# Patient Record
Sex: Male | Born: 1950 | Race: White | Hispanic: No | Marital: Married | Smoking: Former smoker
Health system: Southern US, Community
[De-identification: ages and names within clinical notes are randomized; demographics above are authoritative.]

## PROBLEM LIST (undated history)

## (undated) DIAGNOSIS — R51 Headache: Secondary | ICD-10-CM

## (undated) DIAGNOSIS — J309 Allergic rhinitis, unspecified: Secondary | ICD-10-CM

## (undated) DIAGNOSIS — J930 Spontaneous tension pneumothorax: Secondary | ICD-10-CM

## (undated) DIAGNOSIS — I639 Cerebral infarction, unspecified: Secondary | ICD-10-CM

## (undated) DIAGNOSIS — E119 Type 2 diabetes mellitus without complications: Secondary | ICD-10-CM

## (undated) DIAGNOSIS — R569 Unspecified convulsions: Secondary | ICD-10-CM

## (undated) DIAGNOSIS — Z87898 Personal history of other specified conditions: Secondary | ICD-10-CM

## (undated) DIAGNOSIS — E785 Hyperlipidemia, unspecified: Secondary | ICD-10-CM

## (undated) HISTORY — DX: Cerebral infarction, unspecified: I63.9

## (undated) HISTORY — PX: CATARACT EXTRACTION: SUR2

## (undated) HISTORY — DX: Type 2 diabetes mellitus without complications: E11.9

## (undated) HISTORY — DX: Spontaneous tension pneumothorax: J93.0

## (undated) HISTORY — PX: CARDIAC CATHETERIZATION: SHX172

## (undated) HISTORY — PX: TONSILLECTOMY: SUR1361

## (undated) HISTORY — DX: Hyperlipidemia, unspecified: E78.5

## (undated) HISTORY — DX: Personal history of other specified conditions: Z87.898

## (undated) HISTORY — PX: POPLITEAL SYNOVIAL CYST EXCISION: SUR555

## (undated) HISTORY — DX: Allergic rhinitis, unspecified: J30.9

---

## 2008-05-10 ENCOUNTER — Encounter: Admission: RE | Admit: 2008-05-10 | Discharge: 2008-06-08 | Payer: Self-pay | Admitting: Emergency Medicine

## 2013-08-13 ENCOUNTER — Encounter: Payer: Self-pay | Admitting: General Surgery

## 2013-08-13 ENCOUNTER — Ambulatory Visit (INDEPENDENT_AMBULATORY_CARE_PROVIDER_SITE_OTHER): Payer: Commercial Indemnity | Admitting: Cardiology

## 2013-08-13 ENCOUNTER — Encounter: Payer: Self-pay | Admitting: Cardiology

## 2013-08-13 VITALS — BP 136/85 | HR 70 | Ht 76.0 in | Wt 232.0 lb

## 2013-08-13 DIAGNOSIS — R55 Syncope and collapse: Secondary | ICD-10-CM | POA: Insufficient documentation

## 2013-08-13 DIAGNOSIS — E785 Hyperlipidemia, unspecified: Secondary | ICD-10-CM

## 2013-08-13 DIAGNOSIS — J930 Spontaneous tension pneumothorax: Secondary | ICD-10-CM | POA: Insufficient documentation

## 2013-08-13 DIAGNOSIS — R42 Dizziness and giddiness: Secondary | ICD-10-CM

## 2013-08-13 NOTE — Progress Notes (Signed)
  [address], [address] [place], [place]  [address] Phone: [phone] Fax:  [phone]  Date:  08/13/2013   ID:  Darin Harrington, DOB 04-Sep-1951, MRN [id number]  PCP:  No primary provider on file.  Cardiologist:  NONE     History of Present Illness: Darin Harrington is a 62 y.o. male with a history of DM who presents today for evaluation of dizziness and syncope.and He describes these as small incidences.  He says it starts out as a chill with shaking and feeling cold and then gets flushed and moves to his face and then he feels like he is going to pass out.  Afterwards he will feel nauseated.  They can occur daily but then sometimes he can go weeks without having any symptoms.  He denies any palpitation, chest pain, SOB or syncope.  At night his wife notices his lips moving and his tongue will move out of his mouth.  He has not seen a Neurologist.   Wt Readings from Last 3 Encounters:  08/13/13 232 lb (105.235 kg)     Past Medical History  Diagnosis Date  . Diabetes mellitus without complication   . Hyperlipidemia   . Pneumothorax, spontaneous, tension     Current Outpatient Prescriptions  Medication Sig Dispense Refill  . aspirin 81 MG tablet Take 81 mg by mouth daily.      . metFORMIN (GLUCOPHAGE) 500 MG tablet Take 500 mg by mouth 2 (two) times daily with a meal.        No current facility-administered medications for this visit.    Allergies:   No Known Allergies  Social History:  The patient  reports that he has never smoked. He does not have any smokeless tobacco history on file. He reports that he does not drink alcohol or use illicit drugs.   Family History:  The patient's family history includes Alzheimer's disease in his father.   ROS:  Please see the history of present illness.      All other systems reviewed and negative.   PHYSICAL EXAM: VS:  BP 136/85  Pulse 70  Ht 6\' 4"  (1.93 m)  Wt 232 lb (105.235 kg)  BMI 28.25 kg/m2 Well nourished, well  developed, in no acute distress HEENT: normal Neck: no JVD Cardiac:  normal S1, S2; RRR; no murmur Lungs:  clear to auscultation bilaterally, no wheezing, rhonchi or rales Abd: soft, nontender, no hepatomegaly Ext: no edema Skin: warm and dry Neuro:  CNs 2-12 intact, no focal abnormalities noted  EKG:  NSR   No ST/T wave changes with normal QTcz  ASSESSMENT AND PLAN:  1. Myriad of symptoms consisting of shaking and flushing along with a feeling that he is going to pass out but no syncope.  Also history by his wife of him having lip quivering with his tongue hanging out. ? Cardiogenic vs. Seizure disorder.  He is not orthostatic on exam today.    - ETT to rule out ischemia  - 2D echo to assess for structural heart disease  - Event monitor to assess for arrhythmias 2. DM 3. Chronic Tinnitus  Follwup with me in 4 weeks  Signed, Armanda Magic, MD 08/13/2013 9:06 AM

## 2013-08-13 NOTE — Patient Instructions (Signed)
Your physician recommends that you continue on your current medications as directed. Please refer to the Current Medication list given to you today.  Your physician has requested that you have en exercise stress myoview. For further information please visit [email or web address] Please follow instruction sheet, as given.  Your physician has requested that you have an echocardiogram. Echocardiography is a painless test that uses sound waves to create images of your heart. It provides your doctor with information about the size and shape of your heart and how well your heart's chambers and valves are working. This procedure takes approximately one hour. There are no restrictions for this procedure.  Your physician has recommended that you wear an event monitor. Event monitors are medical devices that record the heart's electrical activity. Doctors most often [place] these monitors to diagnose arrhythmias. Arrhythmias are problems with the speed or rhythm of the heartbeat. The monitor is a small, portable device. You can wear one while you do your normal daily activities. This is usually used to diagnose what is causing palpitations/syncope (passing out).  Your physician recommends that you schedule a follow-up appointment in: 4 weeks with Dr. Mayford Knife

## 2013-08-16 ENCOUNTER — Other Ambulatory Visit: Payer: Self-pay | Admitting: General Surgery

## 2013-08-16 DIAGNOSIS — R55 Syncope and collapse: Secondary | ICD-10-CM

## 2013-08-16 DIAGNOSIS — R42 Dizziness and giddiness: Secondary | ICD-10-CM

## 2013-08-24 ENCOUNTER — Encounter: Payer: Self-pay | Admitting: Cardiology

## 2013-08-24 ENCOUNTER — Encounter (INDEPENDENT_AMBULATORY_CARE_PROVIDER_SITE_OTHER): Payer: Commercial Indemnity

## 2013-08-24 ENCOUNTER — Ambulatory Visit (HOSPITAL_COMMUNITY): Payer: Commercial Indemnity | Attending: Cardiology | Admitting: Cardiology

## 2013-08-24 ENCOUNTER — Encounter: Payer: Self-pay | Admitting: *Deleted

## 2013-08-24 DIAGNOSIS — E785 Hyperlipidemia, unspecified: Secondary | ICD-10-CM | POA: Insufficient documentation

## 2013-08-24 DIAGNOSIS — R55 Syncope and collapse: Secondary | ICD-10-CM

## 2013-08-24 DIAGNOSIS — R42 Dizziness and giddiness: Secondary | ICD-10-CM

## 2013-08-24 DIAGNOSIS — E119 Type 2 diabetes mellitus without complications: Secondary | ICD-10-CM | POA: Insufficient documentation

## 2013-08-24 DIAGNOSIS — I079 Rheumatic tricuspid valve disease, unspecified: Secondary | ICD-10-CM | POA: Insufficient documentation

## 2013-08-24 NOTE — Progress Notes (Signed)
Echo performed. 

## 2013-08-24 NOTE — Progress Notes (Signed)
Patient ID: Darin Harrington, male   DOB: December 08, 1950, 62 y.o.   MRN: [id number] [employer] 30 day cardiac event monitor applied to patient.

## 2013-08-31 ENCOUNTER — Encounter: Payer: Self-pay | Admitting: Cardiology

## 2013-09-01 ENCOUNTER — Ambulatory Visit (INDEPENDENT_AMBULATORY_CARE_PROVIDER_SITE_OTHER): Payer: Commercial Indemnity | Admitting: Cardiology

## 2013-09-01 ENCOUNTER — Encounter (HOSPITAL_COMMUNITY): Payer: Commercial Indemnity

## 2013-09-01 DIAGNOSIS — R55 Syncope and collapse: Secondary | ICD-10-CM

## 2013-09-01 DIAGNOSIS — R42 Dizziness and giddiness: Secondary | ICD-10-CM

## 2013-09-01 NOTE — Progress Notes (Signed)
Exercise Treadmill Test  Pre-Exercise Testing Evaluation Rhythm: normal sinus  Rate: 71 bpm     Test  Exercise Tolerance Test Ordering MD: Armanda Magic, MD  Interpreting MD: Armanda Magic, MD  Unique Test No: 1  Treadmill:  1  Indication for ETT: Dizzy  Contraindication to ETT: No   Stress Modality: exercise - treadmill  Cardiac Imaging Performed: non   Protocol: standard Bruce - maximal  Max BP:  160/112  Max MPHR (bpm):  157 85% MPR (bpm):  133  MPHR obtained (bpm):  164 % MPHR obtained:  104  Reached 85% MPHR (min:sec):  5:58 Total Exercise Time (min-sec):  8:43  Workload in METS:  10.1 Borg Scale: 17  Reason ETT Terminated:  patient's desire to stop    ST Segment Analysis At Rest: normal ST segments - no evidence of significant ST depression With Exercise: no evidence of significant ST depression  Other Information Arrhythmia:  No Angina during ETT:  absent (0) Quality of ETT:  diagnostic  ETT Interpretation:  normal - no evidence of ischemia by ST analysis  Comments: Normal ETTat 10.1 mets  Recommendations: Await results of heart monitor

## 2013-09-06 ENCOUNTER — Telehealth: Payer: Self-pay | Admitting: Cardiology

## 2013-09-06 ENCOUNTER — Ambulatory Visit: Payer: Commercial Indemnity | Admitting: Cardiology

## 2013-09-06 NOTE — Telephone Encounter (Signed)
That is fine - we will go with data we have for 2 weeks and send monitor back

## 2013-09-06 NOTE — Telephone Encounter (Signed)
Made pt aware that he can return monitor and we will go off of the time he used monitor.

## 2013-09-06 NOTE — Telephone Encounter (Signed)
New message    Wearing monitor---now have blisters where electrodes were--pt took off monitor this am and want to send it back--will this be ok?  He is supposed to wear it until dec 24th

## 2013-09-06 NOTE — Telephone Encounter (Signed)
Yes he had a normal ETT and echo so cleared from cardiac standpoint for cataract surgery

## 2013-09-06 NOTE — Telephone Encounter (Signed)
Called and spoke to pt. He has been wearing the monitor since to 24th and he has also had a treadmill and echo done since starting the monitor. They blisters and itching is to bad for pt to even want to try the other electrodes [employer] has to offer. He wants to send back, and make sure Dr Mayford Knife might have been able to get what she needed from two weeks wearing the monitor. TO Dr. Mayford Knife to advise.

## 2013-09-06 NOTE — Telephone Encounter (Signed)
Pt wants to know if he is ok to have cataract surgery.

## 2013-09-06 NOTE — Telephone Encounter (Signed)
Made pt aware. Asked to call me back so I could send to the Dr. Doing the sx.

## 2013-09-08 NOTE — Telephone Encounter (Signed)
Sent to pts PCP and [facility].

## 2013-10-05 ENCOUNTER — Telehealth: Payer: Self-pay | Admitting: Cardiology

## 2013-10-05 NOTE — Telephone Encounter (Signed)
Pt was made aware of his results/ has f/u this week and will discuss further.

## 2013-10-05 NOTE — Telephone Encounter (Signed)
Please let patient know that heart monitor showed NSR with occasional PVC's and PAC's which are benign 

## 2013-10-08 ENCOUNTER — Ambulatory Visit (INDEPENDENT_AMBULATORY_CARE_PROVIDER_SITE_OTHER): Payer: Commercial Indemnity | Admitting: Cardiology

## 2013-10-08 ENCOUNTER — Encounter: Payer: Self-pay | Admitting: General Surgery

## 2013-10-08 ENCOUNTER — Encounter: Payer: Self-pay | Admitting: Cardiology

## 2013-10-08 VITALS — BP 140/86 | HR 74 | Ht 76.0 in | Wt 238.1 lb

## 2013-10-08 DIAGNOSIS — R030 Elevated blood-pressure reading, without diagnosis of hypertension: Secondary | ICD-10-CM

## 2013-10-08 DIAGNOSIS — R55 Syncope and collapse: Secondary | ICD-10-CM

## 2013-10-08 DIAGNOSIS — I77810 Thoracic aortic ectasia: Secondary | ICD-10-CM | POA: Insufficient documentation

## 2013-10-08 DIAGNOSIS — IMO0001 Reserved for inherently not codable concepts without codable children: Secondary | ICD-10-CM

## 2013-10-08 DIAGNOSIS — R42 Dizziness and giddiness: Secondary | ICD-10-CM

## 2013-10-08 NOTE — Progress Notes (Signed)
  [address] [place], [place]  [address] Phone: [phone] Fax:  [phone]  Date:  10/08/2013   ID:  Darin Harrington, DOB October 27, 1950, MRN [id number]  PCP:  Haywood Pao, MD  Cardiologist:  Fransico Him, MD     History of Present Illness: Darin Harrington is a 63 y.o. male who recently saw me for presyncopal episodes with normal cardiac workup with echo and stress test. He described these as small incidences. He says it would start out as a chill with shaking and feeling cold and then he would get flushed and it would move to his face and then he would  Feel like he was going to pass out. Afterwards he would feel nauseated. They could occur daily but then sometimes he could go weeks without having any symptoms. He denied any palpitation, chest pain, SOB or syncope. At night his wife noticed his lips moving and his tongue would move out of his mouth. He has not seen a Neurologist.  Heart monitor showed normal rhythm with occasional PVC's.  ETT showed no ischemia and 2D echo showed normal LVF with mild TR and mildly dilated aortic root.      Wt Readings from Last 3 Encounters:  10/08/13 238 lb 1.9 oz (108.011 kg)  08/13/13 232 lb (105.235 kg)     Past Medical History  Diagnosis Date  . Diabetes mellitus without complication   . Hyperlipidemia   . Pneumothorax, spontaneous, tension     Current Outpatient Prescriptions  Medication Sig Dispense Refill  . aspirin 81 MG tablet Take 81 mg by mouth daily.      . metFORMIN (GLUCOPHAGE) 500 MG tablet Take 500 mg by mouth 2 (two) times daily with a meal.        No current facility-administered medications for this visit.    Allergies:   No Known Allergies  Social History:  The patient  reports that he has never smoked. He does not have any smokeless tobacco history on file. He reports that he does not drink alcohol or use illicit drugs.   Family History:  The patient's family history includes Alzheimer's disease in  his father.   ROS:  Please see the history of present illness.      All other systems reviewed and negative.   PHYSICAL EXAM: VS:  BP 140/86  Pulse 74  Ht 6\' 4"  (1.93 m)  Wt 238 lb 1.9 oz (108.011 kg)  BMI 29.00 kg/m2 Well nourished, well developed, in no acute distress HEENT: normal Neck: no JVD Cardiac:  normal S1, S2; RRR; no murmur Lungs:  clear to auscultation bilaterally, no wheezing, rhonchi or rales Abd: soft, nontender, no hepatomegaly Ext: no edema Skin: warm and dry Neuro:  CNs 2-12 intact, no focal abnormalities noted   ASSESSMENT AND PLAN:  1.  Presyncope - he continues to have symptoms  - will get aTilt table test - if this is normal then I recommend he be referred to a Neurologist 2.  Mildly dilated aortic root  - 2D echo in 6 months to reassess 3.  Mildly elevated DBP in the setting of aortic root dilatation  - I have asked him to check his BP daily for a week and call with the results   Followup with me in 1 year  Signed, Fransico Him, MD 10/08/2013 4:14 PM

## 2013-10-08 NOTE — Patient Instructions (Addendum)
Your physician recommends that you continue on your current medications as directed. Please refer to the Current Medication list given to you today.  Your physician has recommended that you have a tilt table test. This test is sometimes used to help determine the cause of fainting spells. You lie on a table that moves from a lying down to an upright position. The change in position can bring on loss of consciousness. The doctor monitors your symptoms, heart rate, EKG, and blood pressure throughout the test. The doctor also may give you a medicine and then monitor your response to the medicine. This is done in the hospital and usually takes half of a day to complete the procedure. Please see the instruction sheet given to you today for more information.( Dr Radford Pax wants this scheduled for 10/22/13)  Your physician has requested that you have an echocardiogram. Echocardiography is a painless test that uses sound waves to create images of your heart. It provides your doctor with information about the size and shape of your heart and how well your heart's chambers and valves are working. This procedure takes approximately one hour. There are no restrictions for this procedure. ( Schedule 6 Months out)  Your physician has requested that you regularly monitor and record your blood pressure readings at home. Please use the same machine at the same time of day to check your readings and record them for one week and call us with the results.  Your physician wants you to follow-up in: 12 months with Dr Mallie Snooks will receive a reminder letter in the mail two months in advance. If you don't receive a letter, please call our office to schedule the follow-up appointment.

## 2013-10-21 ENCOUNTER — Telehealth: Payer: Self-pay | Admitting: *Deleted

## 2013-10-21 ENCOUNTER — Telehealth: Payer: Self-pay | Admitting: Cardiology

## 2013-10-21 NOTE — Telephone Encounter (Signed)
To Dr Radford Pax to make aware.  Send back to me once reviewed so I can cancel.

## 2013-10-21 NOTE — Telephone Encounter (Signed)
Pt requests his Tilt Table test be cancelled.  Pt called to check on whether his tilt table test required prior authorization from his [employer].  I called [employer], [phone] to verify (CPT [id number]), per [phone] it does require prior auth (in the past has not required it either).  I relayed this to the pt but he had received a phone call from the [facility] preservice center that the test would be approx $660.  I informed him that he could still have the test and, if needed, could be billed later after the claim was filed.  He could then see exactly what his responsibility would be after insurance and be set up on a payment plan.  He stated he didn't want to incur this kind of debt and hasn't had anymore problems.

## 2013-10-21 NOTE — Telephone Encounter (Signed)
reviewed

## 2013-10-21 NOTE — Telephone Encounter (Signed)
Dr Caryl Comes spoke with this pt about tilt table testing. Pt requesting to cancel procedure, stating it wasn't worth it - Dr. Caryl Comes and pt discussed this and reasoning. Tilt table test cancelled, f/u with Dr Radford Pax. Pt agreeable to plan.

## 2013-10-21 NOTE — Telephone Encounter (Signed)
Cancelled.  

## 2013-10-22 ENCOUNTER — Ambulatory Visit (HOSPITAL_COMMUNITY)
Admission: RE | Admit: 2013-10-22 | Payer: Managed Care, Other (non HMO) | Source: Ambulatory Visit | Admitting: Internal Medicine

## 2013-10-22 ENCOUNTER — Encounter (HOSPITAL_COMMUNITY): Admission: RE | Payer: Self-pay | Source: Ambulatory Visit

## 2013-10-22 SURGERY — TILT TABLE STUDY
Anesthesia: LOCAL

## 2013-11-04 ENCOUNTER — Encounter (HOSPITAL_COMMUNITY): Payer: Self-pay | Admitting: Emergency Medicine

## 2013-11-04 ENCOUNTER — Observation Stay (HOSPITAL_COMMUNITY): Payer: Managed Care, Other (non HMO)

## 2013-11-04 ENCOUNTER — Observation Stay (HOSPITAL_COMMUNITY)
Admission: EM | Admit: 2013-11-04 | Discharge: 2013-11-05 | Disposition: A | Discharge status: Home / Self Care | Payer: Managed Care, Other (non HMO) | Attending: Neurosurgery | Admitting: Neurosurgery

## 2013-11-04 ENCOUNTER — Emergency Department (HOSPITAL_COMMUNITY): Payer: Managed Care, Other (non HMO)

## 2013-11-04 DIAGNOSIS — R220 Localized swelling, mass and lump, head: Secondary | ICD-10-CM

## 2013-11-04 DIAGNOSIS — G939 Disorder of brain, unspecified: Secondary | ICD-10-CM | POA: Insufficient documentation

## 2013-11-04 DIAGNOSIS — G936 Cerebral edema: Secondary | ICD-10-CM | POA: Insufficient documentation

## 2013-11-04 DIAGNOSIS — E785 Hyperlipidemia, unspecified: Secondary | ICD-10-CM | POA: Insufficient documentation

## 2013-11-04 DIAGNOSIS — G9389 Other specified disorders of brain: Secondary | ICD-10-CM | POA: Diagnosis present

## 2013-11-04 DIAGNOSIS — Z9849 Cataract extraction status, unspecified eye: Secondary | ICD-10-CM | POA: Insufficient documentation

## 2013-11-04 DIAGNOSIS — G40309 Generalized idiopathic epilepsy and epileptic syndromes, not intractable, without status epilepticus: Secondary | ICD-10-CM | POA: Insufficient documentation

## 2013-11-04 DIAGNOSIS — R900 Intracranial space-occupying lesion found on diagnostic imaging of central nervous system: Secondary | ICD-10-CM

## 2013-11-04 DIAGNOSIS — R569 Unspecified convulsions: Secondary | ICD-10-CM

## 2013-11-04 DIAGNOSIS — E119 Type 2 diabetes mellitus without complications: Secondary | ICD-10-CM | POA: Insufficient documentation

## 2013-11-04 DIAGNOSIS — R221 Localized swelling, mass and lump, neck: Secondary | ICD-10-CM

## 2013-11-04 DIAGNOSIS — D320 Benign neoplasm of cerebral meninges: Principal | ICD-10-CM | POA: Insufficient documentation

## 2013-11-04 HISTORY — DX: Unspecified convulsions: R56.9

## 2013-11-04 HISTORY — DX: Headache: R51

## 2013-11-04 LAB — COMPREHENSIVE METABOLIC PANEL
ALBUMIN: 3.5 g/dL (ref 3.5–5.2)
ALK PHOS: 70 U/L (ref 39–117)
ALT: 16 U/L (ref 0–53)
AST: 14 U/L (ref 0–37)
BUN: 15 mg/dL (ref 6–23)
CO2: 23 mEq/L (ref 19–32)
Calcium: 8.8 mg/dL (ref 8.4–10.5)
Chloride: 105 mEq/L (ref 96–112)
Creatinine, Ser: 0.85 mg/dL (ref 0.50–1.35)
GFR calc non Af Amer: >90 mL/min (ref >90)
GLUCOSE: 158 mg/dL — AB (ref 70–99)
POTASSIUM: 4.2 meq/L (ref 3.7–5.3)
SODIUM: 141 meq/L (ref 137–147)
TOTAL PROTEIN: 6.3 g/dL (ref 6.0–8.3)
Total Bilirubin: 0.3 mg/dL (ref 0.3–1.2)

## 2013-11-04 LAB — CBC
HCT: 42 % (ref 39.0–52.0)
Hemoglobin: 14.6 g/dL (ref 13.0–17.0)
MCH: 32 pg (ref 26.0–34.0)
MCHC: 34.8 g/dL (ref 30.0–36.0)
MCV: 92.1 fL (ref 78.0–100.0)
Platelets: 175 10*3/uL (ref 150–400)
RBC: 4.56 MIL/uL (ref 4.22–5.81)
RDW: 12.7 % (ref 11.5–15.5)
WBC: 7.5 10*3/uL (ref 4.0–10.5)

## 2013-11-04 LAB — GLUCOSE, CAPILLARY
GLUCOSE-CAPILLARY: 179 mg/dL — AB (ref 70–99)
Glucose-Capillary: 231 mg/dL — ABNORMAL HIGH (ref 70–99)

## 2013-11-04 MED ORDER — GADOBENATE DIMEGLUMINE 529 MG/ML IV SOLN
20.0000 mL | Freq: Once | INTRAVENOUS | Status: AC
  Administered 2013-11-04: 20 mL via INTRAVENOUS

## 2013-11-04 MED ORDER — ACETAMINOPHEN 325 MG PO TABS: 650.0000 mg | ORAL_TABLET | Freq: Four times a day (QID) | ORAL | Status: DC | PRN

## 2013-11-04 MED ORDER — DEXAMETHASONE SODIUM PHOSPHATE 4 MG/ML IJ SOLN
8.0000 mg | Freq: Four times a day (QID) | INTRAMUSCULAR | Status: DC
  Administered 2013-11-04 – 2013-11-05 (×5): 8 mg via INTRAVENOUS
  Filled 2013-11-04 (×9): qty 2

## 2013-11-04 MED ORDER — PANTOPRAZOLE SODIUM 40 MG IV SOLR
40.0000 mg | Freq: Two times a day (BID) | INTRAVENOUS | Status: DC
  Administered 2013-11-04 (×2): 40 mg via INTRAVENOUS
  Filled 2013-11-04 (×4): qty 40

## 2013-11-04 MED ORDER — DEXAMETHASONE SODIUM PHOSPHATE 10 MG/ML IJ SOLN
10.0000 mg | Freq: Once | INTRAMUSCULAR | Status: AC
  Administered 2013-11-04: 10 mg via INTRAVENOUS
  Filled 2013-11-04: qty 1

## 2013-11-04 MED ORDER — ASPIRIN EC 81 MG PO TBEC
81.0000 mg | DELAYED_RELEASE_TABLET | Freq: Every day | ORAL | Status: DC
  Administered 2013-11-04 – 2013-11-05 (×2): 81 mg via ORAL
  Filled 2013-11-04 (×2): qty 1

## 2013-11-04 MED ORDER — SODIUM CHLORIDE 0.9 % IV SOLN
1000.0000 mg | Freq: Once | INTRAVENOUS | Status: AC
  Administered 2013-11-04: 1000 mg via INTRAVENOUS
  Filled 2013-11-04: qty 10

## 2013-11-04 MED ORDER — CYCLOBENZAPRINE HCL 10 MG PO TABS: 5.0000 mg | ORAL_TABLET | Freq: Three times a day (TID) | ORAL | Status: DC | PRN

## 2013-11-04 MED ORDER — DEXAMETHASONE SODIUM PHOSPHATE 4 MG/ML IJ SOLN: 4.0000 mg | Freq: Four times a day (QID) | INTRAMUSCULAR | Status: DC

## 2013-11-04 MED ORDER — METFORMIN HCL 500 MG PO TABS
500.0000 mg | ORAL_TABLET | Freq: Two times a day (BID) | ORAL | Status: DC
  Administered 2013-11-04 – 2013-11-05 (×2): 500 mg via ORAL
  Filled 2013-11-04 (×4): qty 1

## 2013-11-04 MED ORDER — ACETAMINOPHEN 650 MG RE SUPP: 650.0000 mg | Freq: Four times a day (QID) | RECTAL | Status: DC | PRN

## 2013-11-04 NOTE — ED Notes (Signed)
Pt transported to [facility] by [employer] [facility].

## 2013-11-04 NOTE — H&P (Signed)
Darin Harrington is an 63 y.o. male.   Chief Complaint: generalized tonic clonic seizure HPI: this is a 63 year old gentleman who had a normal eating yesterday evening however his wife awoke at 2:00 in the morning and noticed that he was choking and then started experiencing jerking of both sides of his body consistent with a generalized tonic-clonic seizure. He missed skull patient brought to the [facility] from was evaluated with a CT scan which showed a right frontal mass patient was seen by neurology initiated on Keppra and patient appears to be back to his baseline. He currently denies any headaches denies any nausea vomiting denies any numbness in his arms or his legs. He does report over last year to work with his medical doctor in his cardiologist these episodes of flushing that they thought might be cardiac related however workup has been negative.  Past Medical History  Diagnosis Date  . Hyperlipidemia   . Pneumothorax, spontaneous, tension   . Diabetes mellitus without complication     Past Surgical History  Procedure Laterality Date  . Cataract extraction    . Cardiac catheterization      normal coronary arteries  . Popliteal synovial cyst excision      Family History  Problem Relation Age of Onset  . Alzheimer's disease Father    Social History:  reports that he has never smoked. He does not have any smokeless tobacco history on file. He reports that he does not drink alcohol or use illicit drugs.  Allergies: No Known Allergies   (Not in a hospital admission)  Results for orders placed during the hospital encounter of 11/04/13 (from the past 48 hour(s))  CBC     Status: None   Collection Time    11/04/13  4:01 AM      Result Value Range   WBC 7.5  4.0 - 10.5 K/uL   RBC 4.56  4.22 - 5.81 MIL/uL   Hemoglobin 14.6  13.0 - 17.0 g/dL   HCT 42.0  39.0 - 52.0 %   MCV 92.1  78.0 - 100.0 fL   MCH 32.0  26.0 - 34.0 pg   MCHC 34.8  30.0 - 36.0 g/dL   RDW 12.7  11.5 - 15.5 %    Platelets 175  150 - 400 K/uL  COMPREHENSIVE METABOLIC PANEL     Status: Abnormal   Collection Time    11/04/13  4:01 AM      Result Value Range   Sodium 141  137 - 147 mEq/L   Potassium 4.2  3.7 - 5.3 mEq/L   Chloride 105  96 - 112 mEq/L   CO2 23  19 - 32 mEq/L   Glucose, Bld 158 (*) 70 - 99 mg/dL   BUN 15  6 - 23 mg/dL   Creatinine, Ser 0.85  0.50 - 1.35 mg/dL   Calcium 8.8  8.4 - 10.5 mg/dL   Total Protein 6.3  6.0 - 8.3 g/dL   Albumin 3.5  3.5 - 5.2 g/dL   AST 14  0 - 37 U/L   ALT 16  0 - 53 U/L   Alkaline Phosphatase 70  39 - 117 U/L   Total Bilirubin 0.3  0.3 - 1.2 mg/dL   GFR calc non Af Amer >90  >90 mL/min   GFR calc Af Amer >90  >90 mL/min   Comment: (NOTE)     The eGFR has been calculated using the CKD EPI equation.     This calculation  has not been validated in all clinical situations.     eGFR's persistently <90 mL/min signify possible Chronic Kidney     Disease.   Ct Head Wo Contrast  11/04/2013   CLINICAL DATA:  Seizure  EXAM: CT HEAD WITHOUT CONTRAST  TECHNIQUE: Contiguous axial images were obtained from the base of the skull through the vertex without intravenous contrast.  COMPARISON:  None available.  FINDINGS: A slightly hyperdense heterogeneous mass measuring 4.5 x 4.8 cm is present within the right frontal lobe (series 2, image 17). The mass extends inferiorly to involve the anterior medial aspect of the right temporal lobe. There is associated vasogenic edema within the adjacent right frontal lobe. There is 7 mm of right-to-left midline shift at the level of the septum pellucidum with partial effacement of the right lateral ventricle. No hydrocephalus. The basilar cisterns remain patent.  No acute intracranial hemorrhage or large vessel territory infarct. No extra-axial fluid collection.  Calvarium is normal.  Orbits are within normal limits.  Paranasal sinuses are clear.  No mastoid effusion.  IMPRESSION: Heterogeneous right frontal lobe mass with associated  vasogenic edema and 7 mm of right-to-left midline shift. No hydrocephalus. Further evaluation with contrast-enhanced brain MRI is recommended.  Critical Value/emergent results were called by telephone at the time of interpretation on 11/04/2013 at 4:53 AM to Dr. Lajean Saver , who verbally acknowledged these results.   Electronically Signed   By: Jeannine Boga M.D.   On: 11/04/2013 04:58    Review of Systems  Constitutional: Negative.   HENT: Positive for congestion.   Eyes: Negative.   Respiratory: Negative.   Cardiovascular: Negative.   Gastrointestinal: Negative.   Genitourinary: Negative.   Musculoskeletal: Negative.   Skin: Negative.   Neurological: Positive for seizures.  Endo/Heme/Allergies: Negative.   Psychiatric/Behavioral: The patient is nervous/anxious.     Blood pressure 133/83, pulse 83, temperature 97.4 F (36.3 C), temperature source Oral, resp. rate 24, SpO2 96.00%. Physical Exam  Constitutional: He is oriented to person, place, and time. He appears well-developed and well-nourished.  HENT:  Head: Normocephalic and atraumatic.  Eyes: Conjunctivae and EOM are normal. Pupils are equal, round, and reactive to light.  Neck: Normal range of motion. Neck supple.  Cardiovascular: Normal rate.   GI: Soft. Bowel sounds are normal.  Musculoskeletal: Normal range of motion.  Neurological: He is alert and oriented to person, place, and time. He has normal strength. GCS eye subscore is 4. GCS verbal subscore is 5. GCS motor subscore is 6.  Reflex Scores:      Tricep reflexes are 2+ on the right side and 2+ on the left side.      Bicep reflexes are 2+ on the right side and 2+ on the left side.      Brachioradialis reflexes are 2+ on the right side and 2+ on the left side.      Patellar reflexes are 2+ on the right side and 2+ on the left side.      Achilles reflexes are 2+ on the right side and 2+ on the left side. Patient is awake and alert she's oriented x4 pupils are  equal extraocular movements are intact strength is 5 out of 5 in his upper and lower extremities with no evidence of pronator drift reflexes are normal and symmetric sensory exam grossly intact     Assessment/Plan 62 years and presents for with a right frontal mass but I think imaging is consistent with a meningioma it is slightly hyperdense  noncontrast CT appears to be originating from the medial sphenoid wing and orbital apex. This does represent about a 5 cm mass with mild to moderate amount mass effect. Patient set up for an MRI scan we'll continue IV Decadron we'll continue Her for seizures we'll admit for observation and discussed based on the MRI treatment plan options.  Ashlon Lottman P 11/04/2013, 7:15 AM

## 2013-11-04 NOTE — ED Notes (Signed)
Report given to floor RN Meredeth Ide.

## 2013-11-04 NOTE — ED Notes (Signed)
MRI paged pt available for transport.

## 2013-11-04 NOTE — ED Notes (Signed)
Pt at home in bed and wife witnessed pt having a gran mal seizure that lasted btwn 5-10 minutes. Wife is a [employer] scientist. Pt was incontinent and had oral trauma per EMS. Pt post ictal upon EMS arrival. Pt presently alert and oriented x 4, neuro intact.  Pt has no history of seizures.

## 2013-11-04 NOTE — ED Notes (Signed)
Family wants confirmation that PCP Tis[facility] has been contacted.

## 2013-11-04 NOTE — ED Provider Notes (Addendum)
CSN: [id number]     Arrival date & time 11/04/13  [id number] History   First MD Initiated Contact with Patient 11/04/13 [phone]     Chief Complaint  Patient presents with  . Seizures   (Consider location/radiation/quality/duration/timing/severity/associated sxs/prior Treatment) Patient is a 63 y.o. male presenting with seizures. The history is provided by the patient.  Seizures pt s/p generalized seizure tonight. No hx same. Per spouse, was in bed, sleeping, she awoke as pt was shaking. Generalized tonic clonic sz activity. Lasted approximately 10 minutes. Bit tongue. No incontinence. Was post-ictal, confused for approximately 10-15 minutes afterwards. Now at baseline. Denies any symptoms or complaints, states recent health at baseline including when went to bed tonight. Has been sleeping fine, eating normally, no unusual stressors. No recent change in meds or new meds. No recent febrile illness. No headaches. No change in speech or vision. No numbness/weakness. No personal or fam hx seizures. States in past year has had mutliple brief 'spells' whereby he will flicker tongue out of mouth, and eyes will twitch.  Pt remains alert during episodes, and states will feel somewhat flushed prior to/during episode. These episodes occur randomly, both awake and while sleeping. Episodes last just a couple seconds. For these episodes has seen pcp and had cardiology workup - all normal per pt/spouse. No prior neuro eval for same.      Past Medical History  Diagnosis Date  . Hyperlipidemia   . Pneumothorax, spontaneous, tension   . Diabetes mellitus without complication    Past Surgical History  Procedure Laterality Date  . Cataract extraction    . Cardiac catheterization      normal coronary arteries  . Popliteal synovial cyst excision     Family History  Problem Relation Age of Onset  . Alzheimer's disease Father    History  Substance Use Topics  . Smoking status: Never Smoker   . Smokeless tobacco: Not  on file  . Alcohol Use: No     Comment: occasional wine    Review of Systems  Constitutional: Negative for fever.  HENT: Negative for sore throat.   Eyes: Negative for visual disturbance.  Respiratory: Negative for shortness of breath.   Cardiovascular: Negative for chest pain and palpitations.  Gastrointestinal: Negative for vomiting, abdominal pain and diarrhea.  Genitourinary: Negative for dysuria and flank pain.  Musculoskeletal: Negative for back pain and neck pain.  Skin: Negative for rash.  Neurological: Positive for seizures. Negative for weakness, numbness and headaches.  Hematological: Does not bruise/bleed easily.  Psychiatric/Behavioral: Negative for dysphoric mood.    Allergies  Review of patient's allergies indicates no known allergies.  Home Medications   Current Outpatient Rx  Name  Route  Sig  Dispense  Refill  . aspirin 81 MG tablet   Oral   Take 81 mg by mouth daily.         Marland Kitchen KRILL OIL PO   Oral   Take 1 capsule by mouth daily.         . metFORMIN (GLUCOPHAGE) 500 MG tablet   Oral   Take 500 mg by mouth 2 (two) times daily with a meal.           BP 139/86  Pulse 98  Temp(Src) 97.4 F (36.3 C) (Oral)  Resp 24  SpO2 95% Physical Exam  Nursing note and vitals reviewed. Constitutional: He is oriented to person, place, and time. He appears well-developed and well-nourished. No distress.  HENT:  Head: Atraumatic.  Minimal contusion to  edge of tongue  Eyes: Conjunctivae and EOM are normal. Pupils are equal, round, and reactive to light.  Neck: Normal range of motion. Neck supple. No tracheal deviation present. No thyromegaly present.  No bruit  Cardiovascular: Normal rate, regular rhythm, normal heart sounds and intact distal pulses.  Exam reveals no gallop and no friction rub.   No murmur heard. Pulmonary/Chest: Effort normal and breath sounds normal. No accessory muscle usage. No respiratory distress.  Abdominal: Soft. He exhibits no  distension. There is no tenderness.  Musculoskeletal: Normal range of motion. He exhibits no edema and no tenderness.  Neurological: He is alert and oriented to person, place, and time. No cranial nerve deficit.  Motor intact bil. No pronator drift. Steady gait.   Skin: Skin is warm and dry. No rash noted. He is not diaphoretic.  Psychiatric: He has a normal mood and affect.    ED Course  Procedures (including critical care time)  Results for orders placed during the hospital encounter of 11/04/13  CBC      Result Value Range   WBC 7.5  4.0 - 10.5 K/uL   RBC 4.56  4.22 - 5.81 MIL/uL   Hemoglobin 14.6  13.0 - 17.0 g/dL   HCT 42.0  39.0 - 52.0 %   MCV 92.1  78.0 - 100.0 fL   MCH 32.0  26.0 - 34.0 pg   MCHC 34.8  30.0 - 36.0 g/dL   RDW 12.7  11.5 - 15.5 %   Platelets 175  150 - 400 K/uL  COMPREHENSIVE METABOLIC PANEL      Result Value Range   Sodium 141  137 - 147 mEq/L   Potassium 4.2  3.7 - 5.3 mEq/L   Chloride 105  96 - 112 mEq/L   CO2 23  19 - 32 mEq/L   Glucose, Bld 158 (*) 70 - 99 mg/dL   BUN 15  6 - 23 mg/dL   Creatinine, Ser 0.85  0.50 - 1.35 mg/dL   Calcium 8.8  8.4 - 10.5 mg/dL   Total Protein 6.3  6.0 - 8.3 g/dL   Albumin 3.5  3.5 - 5.2 g/dL   AST 14  0 - 37 U/L   ALT 16  0 - 53 U/L   Alkaline Phosphatase 70  39 - 117 U/L   Total Bilirubin 0.3  0.3 - 1.2 mg/dL   GFR calc non Af Amer >90  >90 mL/min   GFR calc Af Amer >90  >90 mL/min   Ct Head Wo Contrast  11/04/2013   CLINICAL DATA:  Seizure  EXAM: CT HEAD WITHOUT CONTRAST  TECHNIQUE: Contiguous axial images were obtained from the base of the skull through the vertex without intravenous contrast.  COMPARISON:  None available.  FINDINGS: A slightly hyperdense heterogeneous mass measuring 4.5 x 4.8 cm is present within the right frontal lobe (series 2, image 17). The mass extends inferiorly to involve the anterior medial aspect of the right temporal lobe. There is associated vasogenic edema within the adjacent right  frontal lobe. There is 7 mm of right-to-left midline shift at the level of the septum pellucidum with partial effacement of the right lateral ventricle. No hydrocephalus. The basilar cisterns remain patent.  No acute intracranial hemorrhage or large vessel territory infarct. No extra-axial fluid collection.  Calvarium is normal.  Orbits are within normal limits.  Paranasal sinuses are clear.  No mastoid effusion.  IMPRESSION: Heterogeneous right frontal lobe mass with associated vasogenic edema and 7 mm of  right-to-left midline shift. No hydrocephalus. Further evaluation with contrast-enhanced brain MRI is recommended.  Critical Value/emergent results were called by telephone at the time of interpretation on 11/04/2013 at 4:53 AM to Dr. Lajean Saver , who verbally acknowledged these results.   Electronically Signed   By: Jeannine Boga M.D.   On: 11/04/2013 04:58      MDM  Iv ns. Labs.  Reviewed nursing notes and prior charts for additional history.   Recheck, no sz activity, pt asymptomatic.  Discussed ct.   Will get mri.  Neuro consult re seizure meds.   Will plan obtain mri and ns consult.   Neurology, Dr Leonel Ramsay, evaluated in ED.  Keppra 1 gm iv. Decadron iv.   Discussed w NS, Dr Saintclair Halsted, he will see in ED.  Dr Saintclair Halsted indicates he will admit.    Mirna Mires, MD 11/04/13 [phone]

## 2013-11-04 NOTE — Consult Note (Signed)
Neurology Consultation Reason for Consult: Seizure Referring Physician: Rosine Abe  CC: Seizure  History is obtained from:Patient, wife  HPI: Darin Harrington is a 63 y.o. male with no previous known history of seizures who presents with a new onset seizure lasting approximately 8 minutes per his wife. He has since returned to baseline.   He describes episodes previously lasting for at least several months which consist of flushing and associated eye flutter. This is not associated with any loss of awareness. He was being worked up for these episodes   ROS: A 14 point ROS was performed and is negative except as noted in the HPI.  Past Medical History  Diagnosis Date  . Hyperlipidemia   . Pneumothorax, spontaneous, tension   . Diabetes mellitus without complication     Family History: No history of brain masses  Social History: Tob: denies  Exam: Current vital signs: BP 133/83  Pulse 83  Temp(Src) 97.4 F (36.3 C) (Oral)  Resp 24  SpO2 96% Vital signs in last 24 hours: Temp:  [97.4 F (36.3 C)] 97.4 F (36.3 C) (02/05 0319) Pulse Rate:  [83-98] 83 (02/05 0500) Resp:  [24] 24 (02/05 0319) BP: (133-146)/(70-86) 133/83 mmHg (02/05 0500) SpO2:  [95 %-99 %] 96 % (02/05 0500)  General: in bed, NAD CV: RRR Mental Status: Patient is awake, alert, oriented to person, place, month, year, and situation. Immediate and remote memory are intact. Patient is able to give a clear and coherent history. No signs of aphasia or neglect Cranial Nerves: II: Visual Fields are full. Pupils are equal, round, and reactive to light.  Discs are difficult to visualize. III,IV, VI: EOMI without ptosis or diploplia.  V: Facial sensation is symmetric to temperature VII: Facial movement is symmetric.  VIII: hearing is intact to voice X: Uvula elevates symmetrically XI: Shoulder shrug is symmetric. XII: tongue is midline without atrophy or fasciculations.  Motor: Tone is normal. Bulk is  normal. 5/5 strength was present in all four extremities.  Sensory: Sensation is symmetric to light touch and temperature in the arms and legs. Deep Tendon Reflexes: 2+ and symmetric in the biceps and patellae.  Plantars: Toes are downgoing bilaterally.  Cerebellar: FNF and HKS are intact bilaterally Gait: Not assessed due to multiple medical monitors in ED setting.  I have reviewed labs in epic and the results pertinent to this consultation are: CMP unremarkable.   I have reviewed the images obtained:CT head- large right frontal mass appearing to extend from the sphenoidal region. Significant edema is associated with it.   Impression: 63 yo M with new intracranial mass. It appears to displace part of the brain, making me think that this is an extraaxial mass, but an MRI would provide much more detailed information. I do suspect that his episodes described previously are likely seizures related to this mass.   Recommendations: 1) MRI brain w/wo contrast, consider discussing with neurosurgery depending on results of scan. 2) Decadron 10mg  IV x 1, then 4mg  q6h. Rapidly taper as tolerated.  3) I do not think an EEG would be helpful at this time.  4) keppra 500mg  BID following 1 gm load.   Roland Rack, MD [facility] [phone]  If 7pm- 7am, please page neurology on call at [phone].

## 2013-11-04 NOTE — ED Notes (Signed)
Family updated on the status in the delay for the MRI and that report had been given to the floor RN and they would be taken to their room once done with the MRI.

## 2013-11-05 LAB — GLUCOSE, CAPILLARY
GLUCOSE-CAPILLARY: 195 mg/dL — AB (ref 70–99)
Glucose-Capillary: 184 mg/dL — ABNORMAL HIGH (ref 70–99)

## 2013-11-05 MED ORDER — DEXAMETHASONE 2 MG PO TABS: 0.7500 mg | ORAL_TABLET | Freq: Two times a day (BID) | ORAL | Status: DC

## 2013-11-05 MED ORDER — PANTOPRAZOLE SODIUM 40 MG PO TBEC
40.0000 mg | DELAYED_RELEASE_TABLET | Freq: Two times a day (BID) | ORAL | Status: DC
  Administered 2013-11-05: 40 mg via ORAL
  Filled 2013-11-05: qty 1

## 2013-11-05 MED ORDER — LEVETIRACETAM 500 MG PO TABS
500.0000 mg | ORAL_TABLET | Freq: Two times a day (BID) | ORAL | Status: DC
Start: 2013-11-05 — End: 2018-01-07

## 2013-11-05 NOTE — Progress Notes (Signed)
Patient received discharge instructions from charge nurse and patient discharged home.

## 2013-11-05 NOTE — Progress Notes (Signed)
Pt seen and examined today. No current c/o, states he is essentially at his baseline.  EXAM:  BP 137/76  Pulse 72  Temp(Src) 98.2 F (36.8 C) (Axillary)  Resp 18  Ht 6\' 4"  (1.93 m)  Wt 104.509 kg (230 lb 6.4 oz)  BMI 28.06 kg/m2  SpO2 100%  Awake, alert, oriented  Speech fluent, appropriate  CN grossly intact  5/5 BUE/BLE   IMPRESSION:  63 y.o. 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  I reviewed the general treatment options for meningioma including expectant observation, radiation therapy, and surgery.  I told him that given the size of this tumor and it symptomatically nature, the best treatment was surgical resection. The risks of surgery were discussed in detail including the risk of carotid artery injury leading to catastrophic bleeding or stroke, optic nerve injury, CSF leak, infection, and the risk of coma and death.  I also explained to them that given the approximation of the tumor to the optic nerve and carotid artery, it may not be possible to remove the entirety of the tumor and that a subtotal resection is possible or even likely.  The possible need for adjuvant therapy subsequent to surgery was also discussed.the patient and his son understood our discussion and asked appropriate questions which were answered.  The patient appears eager to proceed with resection.  I will plan on seeing them in the officethis coming Wednesday, February 11, to discuss the surgery further with any other family members who may wish to be present, including the patient's wife.

## 2013-11-10 ENCOUNTER — Other Ambulatory Visit: Payer: Self-pay | Admitting: Neurosurgery

## 2013-11-11 ENCOUNTER — Other Ambulatory Visit (HOSPITAL_COMMUNITY): Payer: Self-pay | Admitting: Neurosurgery

## 2013-11-11 DIAGNOSIS — D320 Benign neoplasm of cerebral meninges: Secondary | ICD-10-CM

## 2013-11-12 ENCOUNTER — Encounter (HOSPITAL_COMMUNITY): Payer: Self-pay | Admitting: Pharmacist

## 2013-11-22 ENCOUNTER — Ambulatory Visit (HOSPITAL_COMMUNITY): Payer: Commercial Indemnity

## 2013-11-23 ENCOUNTER — Inpatient Hospital Stay (HOSPITAL_COMMUNITY): Admission: RE | Admit: 2013-11-23 | Payer: Commercial Indemnity | Source: Ambulatory Visit | Admitting: Neurosurgery

## 2013-11-23 ENCOUNTER — Encounter (HOSPITAL_COMMUNITY): Admission: RE | Payer: Self-pay | Source: Ambulatory Visit

## 2013-11-23 SURGERY — CRANIOTOMY TUMOR EXCISION
Anesthesia: General | Laterality: Right

## 2013-11-25 DIAGNOSIS — H468 Other optic neuritis: Secondary | ICD-10-CM | POA: Insufficient documentation

## 2013-11-25 DIAGNOSIS — H5340 Unspecified visual field defects: Secondary | ICD-10-CM | POA: Insufficient documentation

## 2013-12-07 DIAGNOSIS — R519 Headache, unspecified: Secondary | ICD-10-CM | POA: Insufficient documentation

## 2013-12-07 DIAGNOSIS — R1312 Dysphagia, oropharyngeal phase: Secondary | ICD-10-CM | POA: Insufficient documentation

## 2013-12-07 DIAGNOSIS — R51 Headache: Secondary | ICD-10-CM

## 2013-12-13 NOTE — Discharge Summary (Signed)
  Physician Discharge Summary  Patient ID: ITHAN TOUHEY MRN: [id number] DOB/AGE: 1950/12/04 63 y.o.  Admit date: 11/04/2013 Discharge date: 12/13/2013  Admission Diagnoses: Right frontal mass  Discharge Diagnoses: Right frontal mass Active Problems:   Intracranial mass   Seizure   Brain mass   Discharged Condition: good  Hospital Course: The patient is a 62 year old and was admitted through the emergency department after a witnessed generalized tonic-clonic seizure. Workup revealed a right frontal mass consistent with a medial sphenoid wing meningioma. Patient was stabilized on Keppra and Decadron MRI scan confirmed the initial supposition of this being a medial sphenoid wing meningioma. Patient remained neurologically intact once he recovered from his initial seizure. He had no additional seizures while in the hospital. He was seen by both myself and Dr. Lucia Gaskins to marked and discharge was scheduled followup. At the time of discharge she was neurologically stable under good seizure control with Keppra.  Consults: Significant Diagnostic Studies: Treatments: Discharge Exam: Blood pressure 137/76, pulse 72, temperature 98.2 F (36.8 C), temperature source Axillary, resp. rate 18, height 6\' 4"  (1.93 m), weight 104.509 kg (230 lb 6.4 oz), SpO2 100.00%. Awake alert oriented strength out of 5  Disposition: Home     Medication List    STOP taking these medications       aspirin 81 MG tablet      TAKE these medications       levETIRAcetam 500 MG tablet  Commonly known as:  KEPPRA  Take 1 tablet (500 mg total) by mouth 2 (two) times daily.     metFORMIN 500 MG tablet  Commonly known as:  GLUCOPHAGE  Take 500 mg by mouth See admin instructions. Take 500mg  with breakfast and lunch every day.  Take 500mg  at dinner time as needed if eats a Large dinner         Signed: Jenel Gierke P 12/13/2013, 7:38 AM

## 2014-01-04 DIAGNOSIS — D420 Neoplasm of uncertain behavior of cerebral meninges: Secondary | ICD-10-CM | POA: Insufficient documentation

## 2014-07-05 DIAGNOSIS — S069X0S Unspecified intracranial injury without loss of consciousness, sequela: Secondary | ICD-10-CM | POA: Insufficient documentation

## 2014-07-05 DIAGNOSIS — S069XAS Unspecified intracranial injury with loss of consciousness status unknown, sequela: Secondary | ICD-10-CM | POA: Insufficient documentation

## 2014-07-05 DIAGNOSIS — F32A Depression, unspecified: Secondary | ICD-10-CM | POA: Insufficient documentation

## 2014-07-05 DIAGNOSIS — R269 Unspecified abnormalities of gait and mobility: Secondary | ICD-10-CM | POA: Insufficient documentation

## 2014-07-05 DIAGNOSIS — G479 Sleep disorder, unspecified: Secondary | ICD-10-CM | POA: Insufficient documentation

## 2014-07-05 DIAGNOSIS — F329 Major depressive disorder, single episode, unspecified: Secondary | ICD-10-CM | POA: Insufficient documentation

## 2014-09-29 IMAGING — CT CT HEAD W/O CM
2 series · 16 of 30 positions shown, 18 images · non-contrast
Comparison: None available.

CLINICAL DATA: Seizure

EXAM:
CT HEAD WITHOUT CONTRAST
TECHNIQUE: Contiguous axial images were obtained from the base of the skull
through the vertex without intravenous contrast.

[Series 2: head w/o · axial · non-contrast · 0.49mm/px · z∈[+115,+250]mm · 8 of 35 slices shown, 10 images]
[im 4/35  brain]
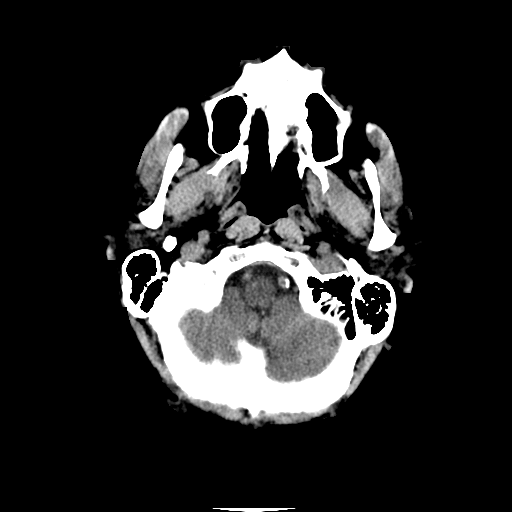
[im 4/35  bone]
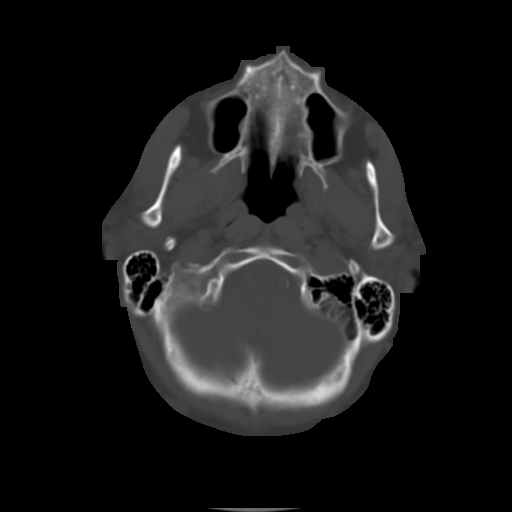
[im 8/35  brain]
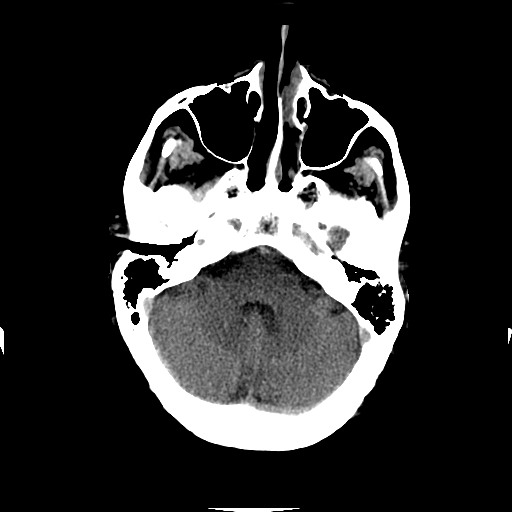
[im 12/35  brain]
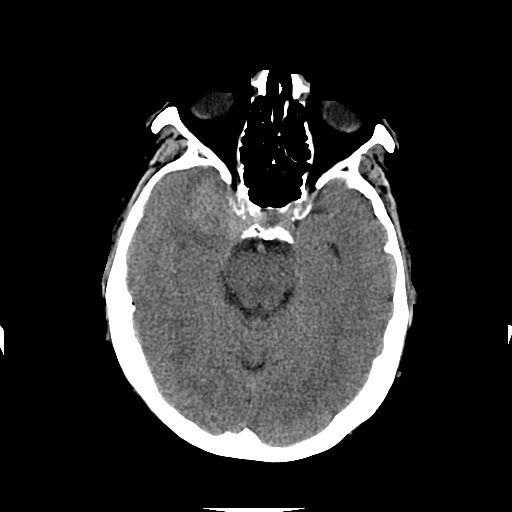
[im 16/35  brain]
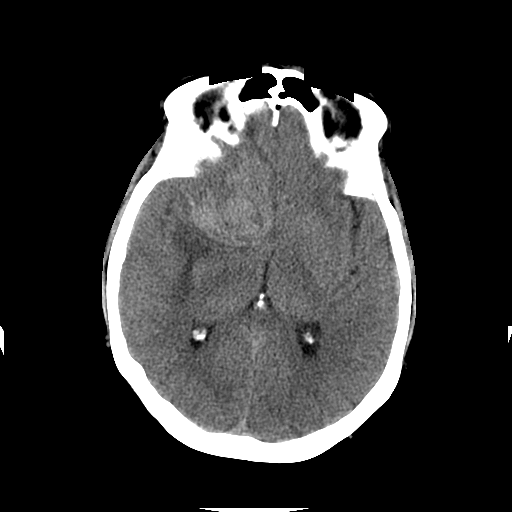
[im 19/35  brain]
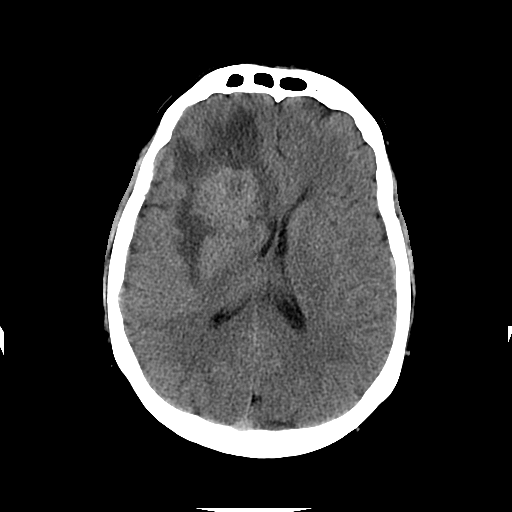
[im 19/35  bone]
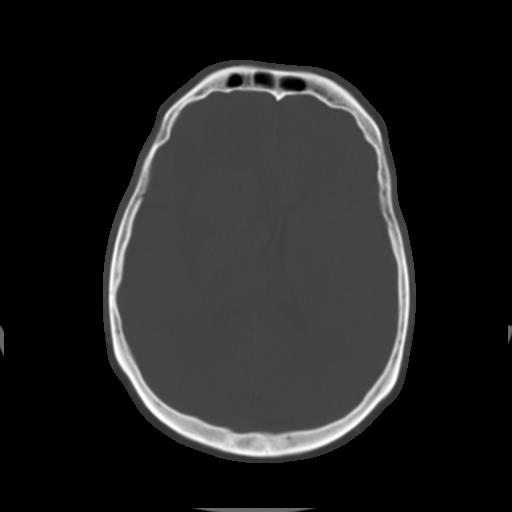
[im 23/35  brain]
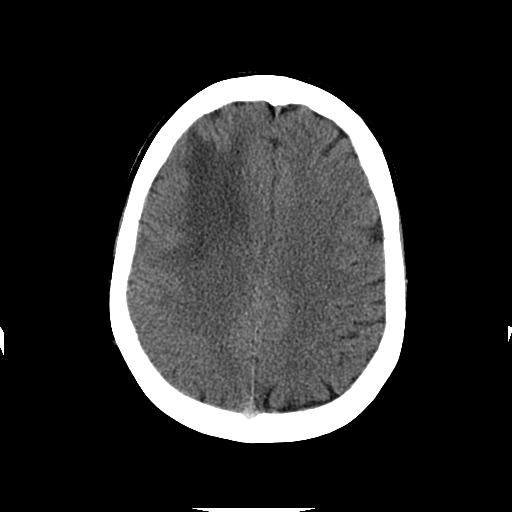
[im 27/35  brain]
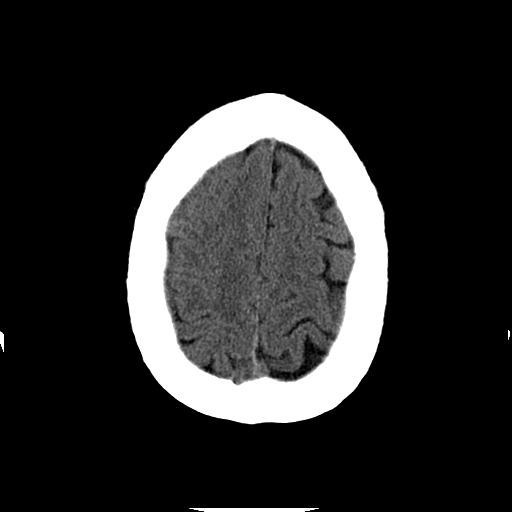
[im 31/35  brain]
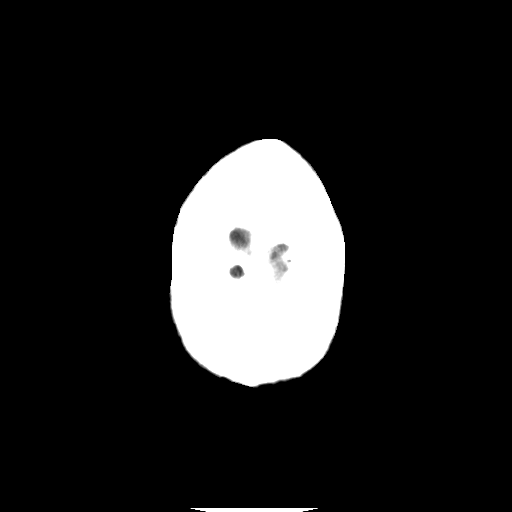

[Series 3: head w/o bone · axial · non-contrast · 0.49mm/px · z∈[+118,+250]mm · 8 of 69 slices shown]
[im 8/69  bone]
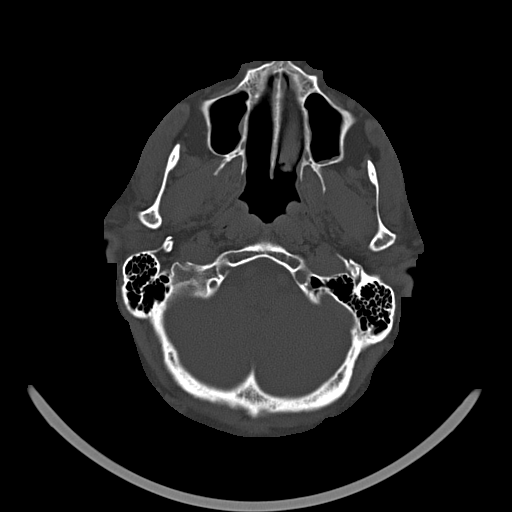
[im 15/69  bone]
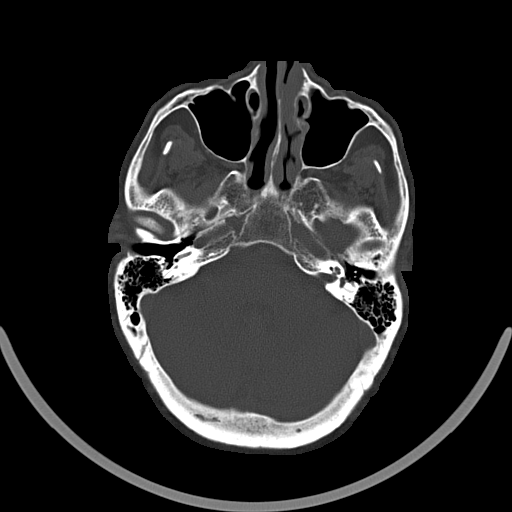
[im 22/69  bone]
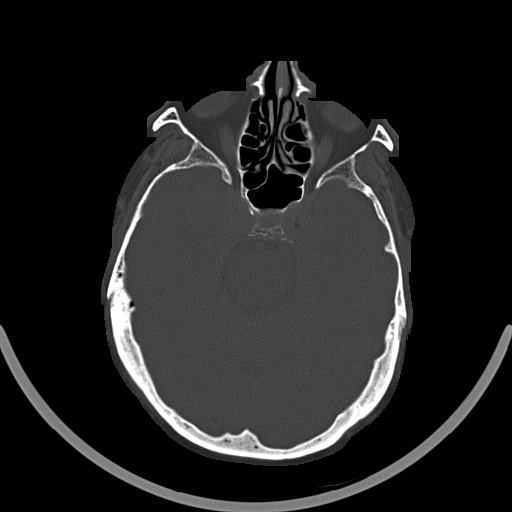
[im 29/69  bone]
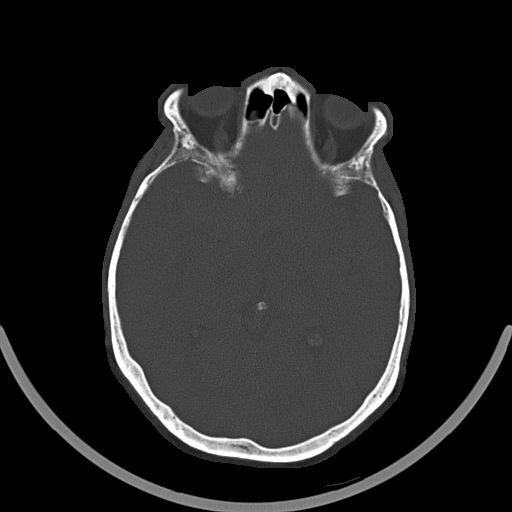
[im 40/69  bone]
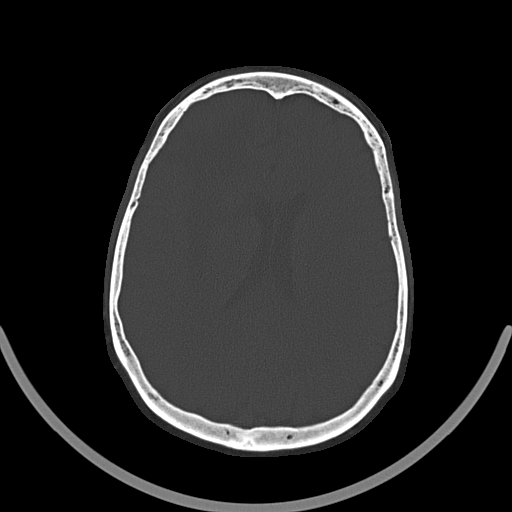
[im 47/69  bone]
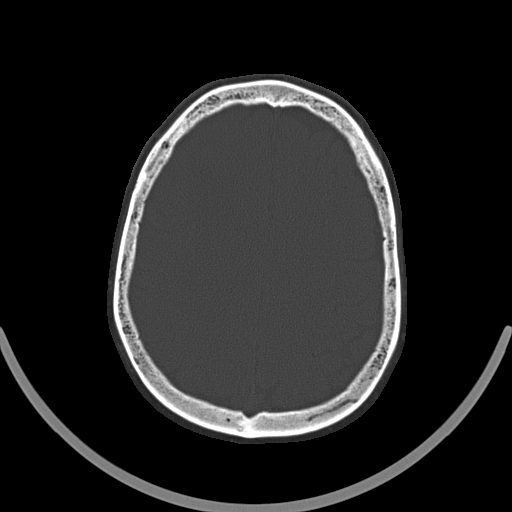
[im 54/69  bone]
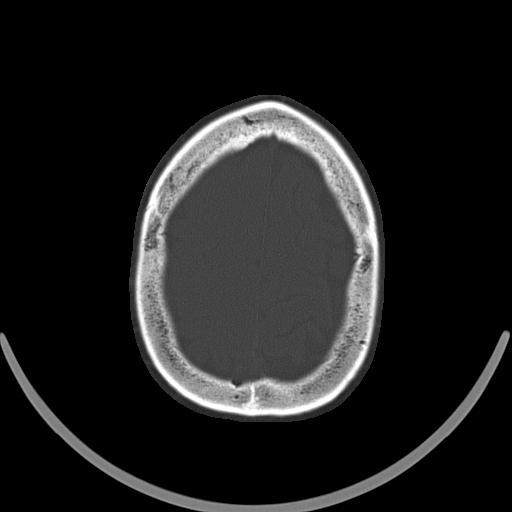
[im 61/69  bone]
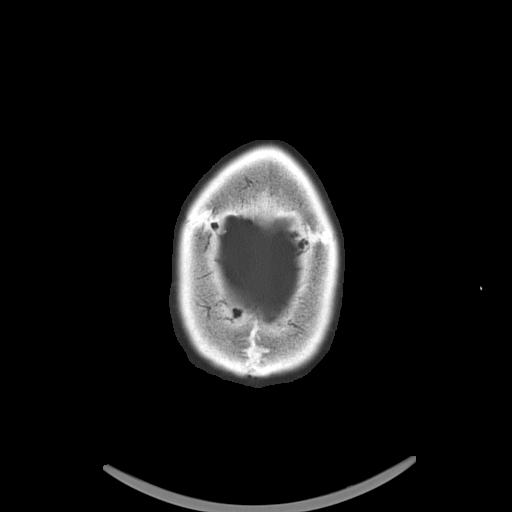

[16 of 30 positions shown; findings below may reference images not displayed]

FINDINGS: A slightly hyperdense heterogeneous mass measuring 4.5 x 4.8 cm is
present within the right frontal lobe (series 2, image 17). The mass
extends inferiorly to involve the anterior medial aspect of the
right temporal lobe. There is associated vasogenic edema within the
adjacent right frontal lobe. There is 7 mm of right-to-left midline
shift at the level of the septum pellucidum with partial effacement
of the right lateral ventricle. No hydrocephalus. The basilar
cisterns remain patent.

No acute intracranial hemorrhage or large vessel territory infarct.
No extra-axial fluid collection.

Calvarium is normal.  Orbits are within normal limits.

Paranasal sinuses are clear.  No mastoid effusion.
IMPRESSION: Heterogeneous right frontal lobe mass with associated vasogenic
edema and 7 mm of right-to-left midline shift. No hydrocephalus.
Further evaluation with contrast-enhanced brain MRI is recommended.

Critical Value/emergent results were called by telephone at the time
of interpretation on 11/04/2013 at [DATE] to Dr. GEUMSUL REDFEAR , who
verbally acknowledged these results.

## 2015-03-12 DIAGNOSIS — G40209 Localization-related (focal) (partial) symptomatic epilepsy and epileptic syndromes with complex partial seizures, not intractable, without status epilepticus: Secondary | ICD-10-CM | POA: Insufficient documentation

## 2015-03-12 DIAGNOSIS — G40109 Localization-related (focal) (partial) symptomatic epilepsy and epileptic syndromes with simple partial seizures, not intractable, without status epilepticus: Secondary | ICD-10-CM

## 2015-06-27 DIAGNOSIS — Z23 Encounter for immunization: Secondary | ICD-10-CM | POA: Diagnosis not present

## 2015-07-04 DIAGNOSIS — H53431 Sector or arcuate defects, right eye: Secondary | ICD-10-CM | POA: Diagnosis not present

## 2015-07-04 DIAGNOSIS — H02401 Unspecified ptosis of right eyelid: Secondary | ICD-10-CM | POA: Diagnosis not present

## 2015-07-04 DIAGNOSIS — H2513 Age-related nuclear cataract, bilateral: Secondary | ICD-10-CM | POA: Diagnosis not present

## 2015-07-04 DIAGNOSIS — H47291 Other optic atrophy, right eye: Secondary | ICD-10-CM | POA: Diagnosis not present

## 2015-07-04 DIAGNOSIS — H5340 Unspecified visual field defects: Secondary | ICD-10-CM | POA: Diagnosis not present

## 2015-07-04 DIAGNOSIS — E669 Obesity, unspecified: Secondary | ICD-10-CM | POA: Diagnosis not present

## 2015-07-04 DIAGNOSIS — E785 Hyperlipidemia, unspecified: Secondary | ICD-10-CM | POA: Diagnosis not present

## 2015-07-04 DIAGNOSIS — E119 Type 2 diabetes mellitus without complications: Secondary | ICD-10-CM | POA: Diagnosis not present

## 2015-07-04 DIAGNOSIS — H468 Other optic neuritis: Secondary | ICD-10-CM | POA: Diagnosis not present

## 2015-07-04 DIAGNOSIS — D420 Neoplasm of uncertain behavior of cerebral meninges: Secondary | ICD-10-CM | POA: Diagnosis not present

## 2015-07-06 DIAGNOSIS — Z8603 Personal history of neoplasm of uncertain behavior: Secondary | ICD-10-CM | POA: Diagnosis not present

## 2015-07-06 DIAGNOSIS — Z09 Encounter for follow-up examination after completed treatment for conditions other than malignant neoplasm: Secondary | ICD-10-CM | POA: Diagnosis not present

## 2015-07-06 DIAGNOSIS — R292 Abnormal reflex: Secondary | ICD-10-CM | POA: Diagnosis not present

## 2015-07-06 DIAGNOSIS — R42 Dizziness and giddiness: Secondary | ICD-10-CM | POA: Diagnosis not present

## 2015-07-06 DIAGNOSIS — Z86011 Personal history of benign neoplasm of the brain: Secondary | ICD-10-CM | POA: Diagnosis not present

## 2015-07-06 DIAGNOSIS — R531 Weakness: Secondary | ICD-10-CM | POA: Diagnosis not present

## 2015-07-06 DIAGNOSIS — H539 Unspecified visual disturbance: Secondary | ICD-10-CM | POA: Diagnosis not present

## 2015-07-06 DIAGNOSIS — D420 Neoplasm of uncertain behavior of cerebral meninges: Secondary | ICD-10-CM | POA: Diagnosis not present

## 2015-07-06 DIAGNOSIS — Z483 Aftercare following surgery for neoplasm: Secondary | ICD-10-CM | POA: Diagnosis not present

## 2015-07-06 DIAGNOSIS — R51 Headache: Secondary | ICD-10-CM | POA: Diagnosis not present

## 2015-08-16 DIAGNOSIS — E119 Type 2 diabetes mellitus without complications: Secondary | ICD-10-CM | POA: Diagnosis not present

## 2015-08-16 DIAGNOSIS — Z1389 Encounter for screening for other disorder: Secondary | ICD-10-CM | POA: Diagnosis not present

## 2015-08-16 DIAGNOSIS — D329 Benign neoplasm of meninges, unspecified: Secondary | ICD-10-CM | POA: Diagnosis not present

## 2015-08-16 DIAGNOSIS — R109 Unspecified abdominal pain: Secondary | ICD-10-CM | POA: Diagnosis not present

## 2015-08-16 DIAGNOSIS — R569 Unspecified convulsions: Secondary | ICD-10-CM | POA: Diagnosis not present

## 2015-08-16 DIAGNOSIS — Z23 Encounter for immunization: Secondary | ICD-10-CM | POA: Diagnosis not present

## 2015-08-16 DIAGNOSIS — F321 Major depressive disorder, single episode, moderate: Secondary | ICD-10-CM | POA: Diagnosis not present

## 2015-08-16 DIAGNOSIS — E538 Deficiency of other specified B group vitamins: Secondary | ICD-10-CM | POA: Diagnosis not present

## 2015-08-16 DIAGNOSIS — Z6827 Body mass index (BMI) 27.0-27.9, adult: Secondary | ICD-10-CM | POA: Diagnosis not present

## 2015-08-16 DIAGNOSIS — F418 Other specified anxiety disorders: Secondary | ICD-10-CM | POA: Diagnosis not present

## 2015-08-16 DIAGNOSIS — E784 Other hyperlipidemia: Secondary | ICD-10-CM | POA: Diagnosis not present

## 2015-08-16 DIAGNOSIS — D508 Other iron deficiency anemias: Secondary | ICD-10-CM | POA: Diagnosis not present

## 2016-02-20 DIAGNOSIS — Z125 Encounter for screening for malignant neoplasm of prostate: Secondary | ICD-10-CM | POA: Diagnosis not present

## 2016-02-20 DIAGNOSIS — E784 Other hyperlipidemia: Secondary | ICD-10-CM | POA: Diagnosis not present

## 2016-02-20 DIAGNOSIS — R8299 Other abnormal findings in urine: Secondary | ICD-10-CM | POA: Diagnosis not present

## 2016-02-20 DIAGNOSIS — E538 Deficiency of other specified B group vitamins: Secondary | ICD-10-CM | POA: Diagnosis not present

## 2016-02-20 DIAGNOSIS — E119 Type 2 diabetes mellitus without complications: Secondary | ICD-10-CM | POA: Diagnosis not present

## 2016-02-27 DIAGNOSIS — Z6827 Body mass index (BMI) 27.0-27.9, adult: Secondary | ICD-10-CM | POA: Diagnosis not present

## 2016-02-27 DIAGNOSIS — D692 Other nonthrombocytopenic purpura: Secondary | ICD-10-CM | POA: Diagnosis not present

## 2016-02-27 DIAGNOSIS — R569 Unspecified convulsions: Secondary | ICD-10-CM | POA: Diagnosis not present

## 2016-02-27 DIAGNOSIS — F418 Other specified anxiety disorders: Secondary | ICD-10-CM | POA: Diagnosis not present

## 2016-02-27 DIAGNOSIS — Z0000 Encounter for general adult medical examination without abnormal findings: Secondary | ICD-10-CM | POA: Diagnosis not present

## 2016-02-27 DIAGNOSIS — N401 Enlarged prostate with lower urinary tract symptoms: Secondary | ICD-10-CM | POA: Diagnosis not present

## 2016-02-27 DIAGNOSIS — N182 Chronic kidney disease, stage 2 (mild): Secondary | ICD-10-CM | POA: Diagnosis not present

## 2016-02-27 DIAGNOSIS — E538 Deficiency of other specified B group vitamins: Secondary | ICD-10-CM | POA: Diagnosis not present

## 2016-02-27 DIAGNOSIS — F321 Major depressive disorder, single episode, moderate: Secondary | ICD-10-CM | POA: Diagnosis not present

## 2016-02-27 DIAGNOSIS — R808 Other proteinuria: Secondary | ICD-10-CM | POA: Diagnosis not present

## 2016-02-27 DIAGNOSIS — D509 Iron deficiency anemia, unspecified: Secondary | ICD-10-CM | POA: Diagnosis not present

## 2016-02-28 DIAGNOSIS — Z1212 Encounter for screening for malignant neoplasm of rectum: Secondary | ICD-10-CM | POA: Diagnosis not present

## 2016-03-04 DIAGNOSIS — Z5181 Encounter for therapeutic drug level monitoring: Secondary | ICD-10-CM | POA: Diagnosis not present

## 2016-03-04 DIAGNOSIS — G40109 Localization-related (focal) (partial) symptomatic epilepsy and epileptic syndromes with simple partial seizures, not intractable, without status epilepticus: Secondary | ICD-10-CM | POA: Diagnosis not present

## 2016-03-04 DIAGNOSIS — D329 Benign neoplasm of meninges, unspecified: Secondary | ICD-10-CM | POA: Diagnosis not present

## 2016-03-04 DIAGNOSIS — D420 Neoplasm of uncertain behavior of cerebral meninges: Secondary | ICD-10-CM | POA: Diagnosis not present

## 2016-04-09 DIAGNOSIS — E784 Other hyperlipidemia: Secondary | ICD-10-CM | POA: Diagnosis not present

## 2016-04-09 DIAGNOSIS — Z79899 Other long term (current) drug therapy: Secondary | ICD-10-CM | POA: Diagnosis not present

## 2016-04-30 DIAGNOSIS — H02401 Unspecified ptosis of right eyelid: Secondary | ICD-10-CM | POA: Diagnosis not present

## 2016-04-30 DIAGNOSIS — Z86011 Personal history of benign neoplasm of the brain: Secondary | ICD-10-CM | POA: Diagnosis not present

## 2016-04-30 DIAGNOSIS — H468 Other optic neuritis: Secondary | ICD-10-CM | POA: Diagnosis not present

## 2016-04-30 DIAGNOSIS — Z09 Encounter for follow-up examination after completed treatment for conditions other than malignant neoplasm: Secondary | ICD-10-CM | POA: Diagnosis not present

## 2016-04-30 DIAGNOSIS — H47291 Other optic atrophy, right eye: Secondary | ICD-10-CM | POA: Diagnosis not present

## 2016-04-30 DIAGNOSIS — H53431 Sector or arcuate defects, right eye: Secondary | ICD-10-CM | POA: Diagnosis not present

## 2016-04-30 DIAGNOSIS — Z9889 Other specified postprocedural states: Secondary | ICD-10-CM | POA: Diagnosis not present

## 2016-04-30 DIAGNOSIS — D420 Neoplasm of uncertain behavior of cerebral meninges: Secondary | ICD-10-CM | POA: Diagnosis not present

## 2016-04-30 DIAGNOSIS — Z8669 Personal history of other diseases of the nervous system and sense organs: Secondary | ICD-10-CM | POA: Diagnosis not present

## 2016-04-30 DIAGNOSIS — H5340 Unspecified visual field defects: Secondary | ICD-10-CM | POA: Diagnosis not present

## 2016-04-30 DIAGNOSIS — H2513 Age-related nuclear cataract, bilateral: Secondary | ICD-10-CM | POA: Diagnosis not present

## 2016-04-30 DIAGNOSIS — Z8603 Personal history of neoplasm of uncertain behavior: Secondary | ICD-10-CM | POA: Diagnosis not present

## 2016-04-30 DIAGNOSIS — E785 Hyperlipidemia, unspecified: Secondary | ICD-10-CM | POA: Diagnosis not present

## 2016-08-08 DIAGNOSIS — J069 Acute upper respiratory infection, unspecified: Secondary | ICD-10-CM | POA: Diagnosis not present

## 2016-08-08 DIAGNOSIS — Z6828 Body mass index (BMI) 28.0-28.9, adult: Secondary | ICD-10-CM | POA: Diagnosis not present

## 2016-08-08 DIAGNOSIS — J209 Acute bronchitis, unspecified: Secondary | ICD-10-CM | POA: Diagnosis not present

## 2016-08-19 DIAGNOSIS — J069 Acute upper respiratory infection, unspecified: Secondary | ICD-10-CM | POA: Diagnosis not present

## 2016-08-19 DIAGNOSIS — R05 Cough: Secondary | ICD-10-CM | POA: Diagnosis not present

## 2016-08-19 DIAGNOSIS — J019 Acute sinusitis, unspecified: Secondary | ICD-10-CM | POA: Diagnosis not present

## 2016-08-19 DIAGNOSIS — Z6828 Body mass index (BMI) 28.0-28.9, adult: Secondary | ICD-10-CM | POA: Diagnosis not present

## 2016-09-06 DIAGNOSIS — F321 Major depressive disorder, single episode, moderate: Secondary | ICD-10-CM | POA: Diagnosis not present

## 2016-09-06 DIAGNOSIS — E119 Type 2 diabetes mellitus without complications: Secondary | ICD-10-CM | POA: Diagnosis not present

## 2016-09-06 DIAGNOSIS — E538 Deficiency of other specified B group vitamins: Secondary | ICD-10-CM | POA: Diagnosis not present

## 2016-09-06 DIAGNOSIS — N182 Chronic kidney disease, stage 2 (mild): Secondary | ICD-10-CM | POA: Diagnosis not present

## 2016-09-06 DIAGNOSIS — R808 Other proteinuria: Secondary | ICD-10-CM | POA: Diagnosis not present

## 2016-09-06 DIAGNOSIS — Z79899 Other long term (current) drug therapy: Secondary | ICD-10-CM | POA: Diagnosis not present

## 2016-09-06 DIAGNOSIS — D329 Benign neoplasm of meninges, unspecified: Secondary | ICD-10-CM | POA: Diagnosis not present

## 2016-09-06 DIAGNOSIS — Z6828 Body mass index (BMI) 28.0-28.9, adult: Secondary | ICD-10-CM | POA: Diagnosis not present

## 2016-09-06 DIAGNOSIS — E784 Other hyperlipidemia: Secondary | ICD-10-CM | POA: Diagnosis not present

## 2016-09-06 DIAGNOSIS — M1990 Unspecified osteoarthritis, unspecified site: Secondary | ICD-10-CM | POA: Diagnosis not present

## 2016-09-06 DIAGNOSIS — D508 Other iron deficiency anemias: Secondary | ICD-10-CM | POA: Diagnosis not present

## 2016-09-06 DIAGNOSIS — R569 Unspecified convulsions: Secondary | ICD-10-CM | POA: Diagnosis not present

## 2016-12-31 DIAGNOSIS — M25511 Pain in right shoulder: Secondary | ICD-10-CM | POA: Diagnosis not present

## 2016-12-31 DIAGNOSIS — M25551 Pain in right hip: Secondary | ICD-10-CM | POA: Diagnosis not present

## 2016-12-31 DIAGNOSIS — M25512 Pain in left shoulder: Secondary | ICD-10-CM | POA: Diagnosis not present

## 2016-12-31 DIAGNOSIS — I639 Cerebral infarction, unspecified: Secondary | ICD-10-CM | POA: Diagnosis not present

## 2017-01-02 DIAGNOSIS — R269 Unspecified abnormalities of gait and mobility: Secondary | ICD-10-CM | POA: Diagnosis not present

## 2017-01-02 DIAGNOSIS — D420 Neoplasm of uncertain behavior of cerebral meninges: Secondary | ICD-10-CM | POA: Diagnosis not present

## 2017-01-02 DIAGNOSIS — G40109 Localization-related (focal) (partial) symptomatic epilepsy and epileptic syndromes with simple partial seizures, not intractable, without status epilepticus: Secondary | ICD-10-CM | POA: Diagnosis not present

## 2017-01-02 DIAGNOSIS — H5340 Unspecified visual field defects: Secondary | ICD-10-CM | POA: Diagnosis not present

## 2017-01-06 DIAGNOSIS — M25551 Pain in right hip: Secondary | ICD-10-CM | POA: Diagnosis not present

## 2017-01-06 DIAGNOSIS — M25511 Pain in right shoulder: Secondary | ICD-10-CM | POA: Diagnosis not present

## 2017-01-06 DIAGNOSIS — M25512 Pain in left shoulder: Secondary | ICD-10-CM | POA: Diagnosis not present

## 2017-01-06 DIAGNOSIS — I639 Cerebral infarction, unspecified: Secondary | ICD-10-CM | POA: Diagnosis not present

## 2017-01-08 DIAGNOSIS — M25551 Pain in right hip: Secondary | ICD-10-CM | POA: Diagnosis not present

## 2017-01-08 DIAGNOSIS — I639 Cerebral infarction, unspecified: Secondary | ICD-10-CM | POA: Diagnosis not present

## 2017-01-08 DIAGNOSIS — M25511 Pain in right shoulder: Secondary | ICD-10-CM | POA: Diagnosis not present

## 2017-01-08 DIAGNOSIS — M25512 Pain in left shoulder: Secondary | ICD-10-CM | POA: Diagnosis not present

## 2017-01-28 DIAGNOSIS — M25551 Pain in right hip: Secondary | ICD-10-CM | POA: Diagnosis not present

## 2017-01-28 DIAGNOSIS — M25511 Pain in right shoulder: Secondary | ICD-10-CM | POA: Diagnosis not present

## 2017-01-28 DIAGNOSIS — M25512 Pain in left shoulder: Secondary | ICD-10-CM | POA: Diagnosis not present

## 2017-01-28 DIAGNOSIS — I639 Cerebral infarction, unspecified: Secondary | ICD-10-CM | POA: Diagnosis not present

## 2017-01-30 DIAGNOSIS — I639 Cerebral infarction, unspecified: Secondary | ICD-10-CM | POA: Diagnosis not present

## 2017-01-30 DIAGNOSIS — M25512 Pain in left shoulder: Secondary | ICD-10-CM | POA: Diagnosis not present

## 2017-01-30 DIAGNOSIS — M25551 Pain in right hip: Secondary | ICD-10-CM | POA: Diagnosis not present

## 2017-01-30 DIAGNOSIS — M25511 Pain in right shoulder: Secondary | ICD-10-CM | POA: Diagnosis not present

## 2017-02-03 DIAGNOSIS — M25512 Pain in left shoulder: Secondary | ICD-10-CM | POA: Diagnosis not present

## 2017-02-03 DIAGNOSIS — M25551 Pain in right hip: Secondary | ICD-10-CM | POA: Diagnosis not present

## 2017-02-03 DIAGNOSIS — M25511 Pain in right shoulder: Secondary | ICD-10-CM | POA: Diagnosis not present

## 2017-02-03 DIAGNOSIS — I639 Cerebral infarction, unspecified: Secondary | ICD-10-CM | POA: Diagnosis not present

## 2017-02-05 DIAGNOSIS — M25511 Pain in right shoulder: Secondary | ICD-10-CM | POA: Diagnosis not present

## 2017-02-05 DIAGNOSIS — M25551 Pain in right hip: Secondary | ICD-10-CM | POA: Diagnosis not present

## 2017-02-05 DIAGNOSIS — I639 Cerebral infarction, unspecified: Secondary | ICD-10-CM | POA: Diagnosis not present

## 2017-02-05 DIAGNOSIS — M25512 Pain in left shoulder: Secondary | ICD-10-CM | POA: Diagnosis not present

## 2017-02-10 DIAGNOSIS — M25551 Pain in right hip: Secondary | ICD-10-CM | POA: Diagnosis not present

## 2017-02-10 DIAGNOSIS — M25512 Pain in left shoulder: Secondary | ICD-10-CM | POA: Diagnosis not present

## 2017-02-10 DIAGNOSIS — I639 Cerebral infarction, unspecified: Secondary | ICD-10-CM | POA: Diagnosis not present

## 2017-02-10 DIAGNOSIS — M25511 Pain in right shoulder: Secondary | ICD-10-CM | POA: Diagnosis not present

## 2017-02-12 DIAGNOSIS — M25511 Pain in right shoulder: Secondary | ICD-10-CM | POA: Diagnosis not present

## 2017-02-12 DIAGNOSIS — I639 Cerebral infarction, unspecified: Secondary | ICD-10-CM | POA: Diagnosis not present

## 2017-02-12 DIAGNOSIS — M25512 Pain in left shoulder: Secondary | ICD-10-CM | POA: Diagnosis not present

## 2017-02-12 DIAGNOSIS — M25551 Pain in right hip: Secondary | ICD-10-CM | POA: Diagnosis not present

## 2017-02-17 DIAGNOSIS — M25512 Pain in left shoulder: Secondary | ICD-10-CM | POA: Diagnosis not present

## 2017-02-17 DIAGNOSIS — I639 Cerebral infarction, unspecified: Secondary | ICD-10-CM | POA: Diagnosis not present

## 2017-02-17 DIAGNOSIS — M25551 Pain in right hip: Secondary | ICD-10-CM | POA: Diagnosis not present

## 2017-02-17 DIAGNOSIS — M25511 Pain in right shoulder: Secondary | ICD-10-CM | POA: Diagnosis not present

## 2017-02-19 DIAGNOSIS — M25511 Pain in right shoulder: Secondary | ICD-10-CM | POA: Diagnosis not present

## 2017-02-19 DIAGNOSIS — I639 Cerebral infarction, unspecified: Secondary | ICD-10-CM | POA: Diagnosis not present

## 2017-02-19 DIAGNOSIS — M25551 Pain in right hip: Secondary | ICD-10-CM | POA: Diagnosis not present

## 2017-02-19 DIAGNOSIS — M25512 Pain in left shoulder: Secondary | ICD-10-CM | POA: Diagnosis not present

## 2017-02-20 DIAGNOSIS — Z125 Encounter for screening for malignant neoplasm of prostate: Secondary | ICD-10-CM | POA: Diagnosis not present

## 2017-02-20 DIAGNOSIS — N182 Chronic kidney disease, stage 2 (mild): Secondary | ICD-10-CM | POA: Diagnosis not present

## 2017-02-20 DIAGNOSIS — E784 Other hyperlipidemia: Secondary | ICD-10-CM | POA: Diagnosis not present

## 2017-02-20 DIAGNOSIS — R8299 Other abnormal findings in urine: Secondary | ICD-10-CM | POA: Diagnosis not present

## 2017-02-20 DIAGNOSIS — E1129 Type 2 diabetes mellitus with other diabetic kidney complication: Secondary | ICD-10-CM | POA: Diagnosis not present

## 2017-02-20 DIAGNOSIS — E538 Deficiency of other specified B group vitamins: Secondary | ICD-10-CM | POA: Diagnosis not present

## 2017-02-25 DIAGNOSIS — M25551 Pain in right hip: Secondary | ICD-10-CM | POA: Diagnosis not present

## 2017-02-25 DIAGNOSIS — M25511 Pain in right shoulder: Secondary | ICD-10-CM | POA: Diagnosis not present

## 2017-02-25 DIAGNOSIS — I639 Cerebral infarction, unspecified: Secondary | ICD-10-CM | POA: Diagnosis not present

## 2017-02-25 DIAGNOSIS — M25512 Pain in left shoulder: Secondary | ICD-10-CM | POA: Diagnosis not present

## 2017-02-27 DIAGNOSIS — D508 Other iron deficiency anemias: Secondary | ICD-10-CM | POA: Diagnosis not present

## 2017-02-27 DIAGNOSIS — D329 Benign neoplasm of meninges, unspecified: Secondary | ICD-10-CM | POA: Diagnosis not present

## 2017-02-27 DIAGNOSIS — R808 Other proteinuria: Secondary | ICD-10-CM | POA: Diagnosis not present

## 2017-02-27 DIAGNOSIS — Z6829 Body mass index (BMI) 29.0-29.9, adult: Secondary | ICD-10-CM | POA: Diagnosis not present

## 2017-02-27 DIAGNOSIS — N182 Chronic kidney disease, stage 2 (mild): Secondary | ICD-10-CM | POA: Diagnosis not present

## 2017-02-27 DIAGNOSIS — E1129 Type 2 diabetes mellitus with other diabetic kidney complication: Secondary | ICD-10-CM | POA: Diagnosis not present

## 2017-02-27 DIAGNOSIS — G4089 Other seizures: Secondary | ICD-10-CM | POA: Diagnosis not present

## 2017-02-27 DIAGNOSIS — F418 Other specified anxiety disorders: Secondary | ICD-10-CM | POA: Diagnosis not present

## 2017-02-27 DIAGNOSIS — Z0000 Encounter for general adult medical examination without abnormal findings: Secondary | ICD-10-CM | POA: Diagnosis not present

## 2017-02-27 DIAGNOSIS — Z1389 Encounter for screening for other disorder: Secondary | ICD-10-CM | POA: Diagnosis not present

## 2017-02-27 DIAGNOSIS — Z79899 Other long term (current) drug therapy: Secondary | ICD-10-CM | POA: Diagnosis not present

## 2017-02-27 DIAGNOSIS — E538 Deficiency of other specified B group vitamins: Secondary | ICD-10-CM | POA: Diagnosis not present

## 2017-03-06 DIAGNOSIS — I639 Cerebral infarction, unspecified: Secondary | ICD-10-CM | POA: Diagnosis not present

## 2017-03-06 DIAGNOSIS — M25511 Pain in right shoulder: Secondary | ICD-10-CM | POA: Diagnosis not present

## 2017-03-06 DIAGNOSIS — M25551 Pain in right hip: Secondary | ICD-10-CM | POA: Diagnosis not present

## 2017-03-06 DIAGNOSIS — M25512 Pain in left shoulder: Secondary | ICD-10-CM | POA: Diagnosis not present

## 2017-03-07 DIAGNOSIS — G40909 Epilepsy, unspecified, not intractable, without status epilepticus: Secondary | ICD-10-CM | POA: Diagnosis not present

## 2017-03-07 DIAGNOSIS — Z86011 Personal history of benign neoplasm of the brain: Secondary | ICD-10-CM | POA: Diagnosis not present

## 2017-03-07 DIAGNOSIS — E559 Vitamin D deficiency, unspecified: Secondary | ICD-10-CM | POA: Diagnosis not present

## 2017-03-07 DIAGNOSIS — Z79899 Other long term (current) drug therapy: Secondary | ICD-10-CM | POA: Diagnosis not present

## 2017-03-07 DIAGNOSIS — H5340 Unspecified visual field defects: Secondary | ICD-10-CM | POA: Diagnosis not present

## 2017-03-07 DIAGNOSIS — Z9889 Other specified postprocedural states: Secondary | ICD-10-CM | POA: Diagnosis not present

## 2017-03-07 DIAGNOSIS — G40119 Localization-related (focal) (partial) symptomatic epilepsy and epileptic syndromes with simple partial seizures, intractable, without status epilepticus: Secondary | ICD-10-CM | POA: Diagnosis not present

## 2017-04-01 DIAGNOSIS — K219 Gastro-esophageal reflux disease without esophagitis: Secondary | ICD-10-CM | POA: Diagnosis not present

## 2017-04-01 DIAGNOSIS — Z1211 Encounter for screening for malignant neoplasm of colon: Secondary | ICD-10-CM | POA: Diagnosis not present

## 2017-04-03 DIAGNOSIS — M25511 Pain in right shoulder: Secondary | ICD-10-CM | POA: Diagnosis not present

## 2017-04-03 DIAGNOSIS — M25512 Pain in left shoulder: Secondary | ICD-10-CM | POA: Diagnosis not present

## 2017-04-03 DIAGNOSIS — I639 Cerebral infarction, unspecified: Secondary | ICD-10-CM | POA: Diagnosis not present

## 2017-04-03 DIAGNOSIS — M25551 Pain in right hip: Secondary | ICD-10-CM | POA: Diagnosis not present

## 2017-04-04 DIAGNOSIS — Z789 Other specified health status: Secondary | ICD-10-CM | POA: Diagnosis not present

## 2017-04-04 DIAGNOSIS — M7502 Adhesive capsulitis of left shoulder: Secondary | ICD-10-CM | POA: Diagnosis not present

## 2017-04-04 DIAGNOSIS — R490 Dysphonia: Secondary | ICD-10-CM | POA: Diagnosis not present

## 2017-04-04 DIAGNOSIS — J019 Acute sinusitis, unspecified: Secondary | ICD-10-CM | POA: Diagnosis not present

## 2017-04-04 DIAGNOSIS — Z6829 Body mass index (BMI) 29.0-29.9, adult: Secondary | ICD-10-CM | POA: Diagnosis not present

## 2017-04-04 DIAGNOSIS — H9319 Tinnitus, unspecified ear: Secondary | ICD-10-CM | POA: Diagnosis not present

## 2017-04-10 DIAGNOSIS — M25512 Pain in left shoulder: Secondary | ICD-10-CM | POA: Diagnosis not present

## 2017-04-11 DIAGNOSIS — D122 Benign neoplasm of ascending colon: Secondary | ICD-10-CM | POA: Diagnosis not present

## 2017-04-11 DIAGNOSIS — K635 Polyp of colon: Secondary | ICD-10-CM | POA: Diagnosis not present

## 2017-04-11 DIAGNOSIS — Z1211 Encounter for screening for malignant neoplasm of colon: Secondary | ICD-10-CM | POA: Diagnosis not present

## 2017-04-11 DIAGNOSIS — D127 Benign neoplasm of rectosigmoid junction: Secondary | ICD-10-CM | POA: Diagnosis not present

## 2017-04-11 DIAGNOSIS — D123 Benign neoplasm of transverse colon: Secondary | ICD-10-CM | POA: Diagnosis not present

## 2017-04-14 DIAGNOSIS — M25512 Pain in left shoulder: Secondary | ICD-10-CM | POA: Diagnosis not present

## 2017-04-28 DIAGNOSIS — M7502 Adhesive capsulitis of left shoulder: Secondary | ICD-10-CM | POA: Diagnosis not present

## 2017-05-16 DIAGNOSIS — H9042 Sensorineural hearing loss, unilateral, left ear, with unrestricted hearing on the contralateral side: Secondary | ICD-10-CM | POA: Diagnosis not present

## 2017-05-16 DIAGNOSIS — IMO0001 Reserved for inherently not codable concepts without codable children: Secondary | ICD-10-CM | POA: Insufficient documentation

## 2017-05-16 DIAGNOSIS — R490 Dysphonia: Secondary | ICD-10-CM | POA: Insufficient documentation

## 2017-05-16 DIAGNOSIS — H903 Sensorineural hearing loss, bilateral: Secondary | ICD-10-CM | POA: Diagnosis not present

## 2017-05-16 DIAGNOSIS — H9312 Tinnitus, left ear: Secondary | ICD-10-CM | POA: Diagnosis not present

## 2017-05-21 DIAGNOSIS — J329 Chronic sinusitis, unspecified: Secondary | ICD-10-CM | POA: Diagnosis not present

## 2017-05-27 DIAGNOSIS — Z8603 Personal history of neoplasm of uncertain behavior: Secondary | ICD-10-CM | POA: Diagnosis not present

## 2017-05-27 DIAGNOSIS — R51 Headache: Secondary | ICD-10-CM | POA: Diagnosis not present

## 2017-05-27 DIAGNOSIS — R531 Weakness: Secondary | ICD-10-CM | POA: Diagnosis not present

## 2017-05-27 DIAGNOSIS — H468 Other optic neuritis: Secondary | ICD-10-CM | POA: Diagnosis not present

## 2017-05-27 DIAGNOSIS — R5383 Other fatigue: Secondary | ICD-10-CM | POA: Diagnosis not present

## 2017-05-27 DIAGNOSIS — Z9889 Other specified postprocedural states: Secondary | ICD-10-CM | POA: Diagnosis not present

## 2017-05-27 DIAGNOSIS — H53431 Sector or arcuate defects, right eye: Secondary | ICD-10-CM | POA: Diagnosis not present

## 2017-05-27 DIAGNOSIS — H5340 Unspecified visual field defects: Secondary | ICD-10-CM | POA: Diagnosis not present

## 2017-05-27 DIAGNOSIS — D420 Neoplasm of uncertain behavior of cerebral meninges: Secondary | ICD-10-CM | POA: Diagnosis not present

## 2017-05-27 DIAGNOSIS — H47291 Other optic atrophy, right eye: Secondary | ICD-10-CM | POA: Diagnosis not present

## 2017-05-28 DIAGNOSIS — M25512 Pain in left shoulder: Secondary | ICD-10-CM | POA: Diagnosis not present

## 2017-05-28 DIAGNOSIS — I639 Cerebral infarction, unspecified: Secondary | ICD-10-CM | POA: Diagnosis not present

## 2017-05-28 DIAGNOSIS — M25511 Pain in right shoulder: Secondary | ICD-10-CM | POA: Diagnosis not present

## 2017-05-28 DIAGNOSIS — M25551 Pain in right hip: Secondary | ICD-10-CM | POA: Diagnosis not present

## 2017-05-29 DIAGNOSIS — M25551 Pain in right hip: Secondary | ICD-10-CM | POA: Diagnosis not present

## 2017-05-29 DIAGNOSIS — M25511 Pain in right shoulder: Secondary | ICD-10-CM | POA: Diagnosis not present

## 2017-05-29 DIAGNOSIS — I639 Cerebral infarction, unspecified: Secondary | ICD-10-CM | POA: Diagnosis not present

## 2017-05-29 DIAGNOSIS — M25512 Pain in left shoulder: Secondary | ICD-10-CM | POA: Diagnosis not present

## 2017-06-03 DIAGNOSIS — M7502 Adhesive capsulitis of left shoulder: Secondary | ICD-10-CM | POA: Diagnosis not present

## 2017-06-05 DIAGNOSIS — M25512 Pain in left shoulder: Secondary | ICD-10-CM | POA: Diagnosis not present

## 2017-06-05 DIAGNOSIS — M25511 Pain in right shoulder: Secondary | ICD-10-CM | POA: Diagnosis not present

## 2017-06-05 DIAGNOSIS — I639 Cerebral infarction, unspecified: Secondary | ICD-10-CM | POA: Diagnosis not present

## 2017-06-05 DIAGNOSIS — M25551 Pain in right hip: Secondary | ICD-10-CM | POA: Diagnosis not present

## 2017-06-10 DIAGNOSIS — M25551 Pain in right hip: Secondary | ICD-10-CM | POA: Diagnosis not present

## 2017-06-10 DIAGNOSIS — I639 Cerebral infarction, unspecified: Secondary | ICD-10-CM | POA: Diagnosis not present

## 2017-06-10 DIAGNOSIS — M25512 Pain in left shoulder: Secondary | ICD-10-CM | POA: Diagnosis not present

## 2017-06-10 DIAGNOSIS — M25511 Pain in right shoulder: Secondary | ICD-10-CM | POA: Diagnosis not present

## 2017-06-12 DIAGNOSIS — I639 Cerebral infarction, unspecified: Secondary | ICD-10-CM | POA: Diagnosis not present

## 2017-06-12 DIAGNOSIS — M25551 Pain in right hip: Secondary | ICD-10-CM | POA: Diagnosis not present

## 2017-06-12 DIAGNOSIS — M25512 Pain in left shoulder: Secondary | ICD-10-CM | POA: Diagnosis not present

## 2017-06-12 DIAGNOSIS — M25511 Pain in right shoulder: Secondary | ICD-10-CM | POA: Diagnosis not present

## 2017-06-13 DIAGNOSIS — D485 Neoplasm of uncertain behavior of skin: Secondary | ICD-10-CM | POA: Diagnosis not present

## 2017-06-13 DIAGNOSIS — D225 Melanocytic nevi of trunk: Secondary | ICD-10-CM | POA: Diagnosis not present

## 2017-06-13 DIAGNOSIS — Z1283 Encounter for screening for malignant neoplasm of skin: Secondary | ICD-10-CM | POA: Diagnosis not present

## 2017-06-16 DIAGNOSIS — I639 Cerebral infarction, unspecified: Secondary | ICD-10-CM | POA: Diagnosis not present

## 2017-06-16 DIAGNOSIS — M25511 Pain in right shoulder: Secondary | ICD-10-CM | POA: Diagnosis not present

## 2017-06-16 DIAGNOSIS — M25551 Pain in right hip: Secondary | ICD-10-CM | POA: Diagnosis not present

## 2017-06-16 DIAGNOSIS — M25512 Pain in left shoulder: Secondary | ICD-10-CM | POA: Diagnosis not present

## 2017-06-18 DIAGNOSIS — M25551 Pain in right hip: Secondary | ICD-10-CM | POA: Diagnosis not present

## 2017-06-18 DIAGNOSIS — M25512 Pain in left shoulder: Secondary | ICD-10-CM | POA: Diagnosis not present

## 2017-06-18 DIAGNOSIS — M25511 Pain in right shoulder: Secondary | ICD-10-CM | POA: Diagnosis not present

## 2017-06-18 DIAGNOSIS — I639 Cerebral infarction, unspecified: Secondary | ICD-10-CM | POA: Diagnosis not present

## 2017-06-23 DIAGNOSIS — M25512 Pain in left shoulder: Secondary | ICD-10-CM | POA: Diagnosis not present

## 2017-06-23 DIAGNOSIS — Z23 Encounter for immunization: Secondary | ICD-10-CM | POA: Diagnosis not present

## 2017-06-23 DIAGNOSIS — I639 Cerebral infarction, unspecified: Secondary | ICD-10-CM | POA: Diagnosis not present

## 2017-06-23 DIAGNOSIS — M25551 Pain in right hip: Secondary | ICD-10-CM | POA: Diagnosis not present

## 2017-06-23 DIAGNOSIS — M25511 Pain in right shoulder: Secondary | ICD-10-CM | POA: Diagnosis not present

## 2017-06-24 DIAGNOSIS — D485 Neoplasm of uncertain behavior of skin: Secondary | ICD-10-CM | POA: Diagnosis not present

## 2017-06-24 DIAGNOSIS — L988 Other specified disorders of the skin and subcutaneous tissue: Secondary | ICD-10-CM | POA: Diagnosis not present

## 2017-07-28 DIAGNOSIS — Z6829 Body mass index (BMI) 29.0-29.9, adult: Secondary | ICD-10-CM | POA: Diagnosis not present

## 2017-07-28 DIAGNOSIS — H6691 Otitis media, unspecified, right ear: Secondary | ICD-10-CM | POA: Diagnosis not present

## 2017-08-19 DIAGNOSIS — R066 Hiccough: Secondary | ICD-10-CM | POA: Diagnosis not present

## 2017-08-19 DIAGNOSIS — K219 Gastro-esophageal reflux disease without esophagitis: Secondary | ICD-10-CM | POA: Diagnosis not present

## 2017-08-19 DIAGNOSIS — R1310 Dysphagia, unspecified: Secondary | ICD-10-CM | POA: Diagnosis not present

## 2017-08-19 DIAGNOSIS — R142 Eructation: Secondary | ICD-10-CM | POA: Diagnosis not present

## 2017-09-01 DIAGNOSIS — K1120 Sialoadenitis, unspecified: Secondary | ICD-10-CM | POA: Diagnosis not present

## 2017-09-01 DIAGNOSIS — Z6828 Body mass index (BMI) 28.0-28.9, adult: Secondary | ICD-10-CM | POA: Diagnosis not present

## 2017-09-01 DIAGNOSIS — H9202 Otalgia, left ear: Secondary | ICD-10-CM | POA: Diagnosis not present

## 2017-09-05 DIAGNOSIS — R809 Proteinuria, unspecified: Secondary | ICD-10-CM | POA: Diagnosis not present

## 2017-09-05 DIAGNOSIS — N182 Chronic kidney disease, stage 2 (mild): Secondary | ICD-10-CM | POA: Diagnosis not present

## 2017-09-05 DIAGNOSIS — G4089 Other seizures: Secondary | ICD-10-CM | POA: Diagnosis not present

## 2017-09-05 DIAGNOSIS — E538 Deficiency of other specified B group vitamins: Secondary | ICD-10-CM | POA: Diagnosis not present

## 2017-09-05 DIAGNOSIS — Z6828 Body mass index (BMI) 28.0-28.9, adult: Secondary | ICD-10-CM | POA: Diagnosis not present

## 2017-09-05 DIAGNOSIS — D508 Other iron deficiency anemias: Secondary | ICD-10-CM | POA: Diagnosis not present

## 2017-09-05 DIAGNOSIS — M1990 Unspecified osteoarthritis, unspecified site: Secondary | ICD-10-CM | POA: Diagnosis not present

## 2017-09-05 DIAGNOSIS — F321 Major depressive disorder, single episode, moderate: Secondary | ICD-10-CM | POA: Diagnosis not present

## 2017-09-05 DIAGNOSIS — E7849 Other hyperlipidemia: Secondary | ICD-10-CM | POA: Diagnosis not present

## 2017-09-05 DIAGNOSIS — E1129 Type 2 diabetes mellitus with other diabetic kidney complication: Secondary | ICD-10-CM | POA: Diagnosis not present

## 2017-09-05 DIAGNOSIS — R808 Other proteinuria: Secondary | ICD-10-CM | POA: Diagnosis not present

## 2017-09-05 DIAGNOSIS — D329 Benign neoplasm of meninges, unspecified: Secondary | ICD-10-CM | POA: Diagnosis not present

## 2017-09-05 DIAGNOSIS — D692 Other nonthrombocytopenic purpura: Secondary | ICD-10-CM | POA: Diagnosis not present

## 2017-09-05 DIAGNOSIS — Z79899 Other long term (current) drug therapy: Secondary | ICD-10-CM | POA: Diagnosis not present

## 2017-10-29 DIAGNOSIS — Z6829 Body mass index (BMI) 29.0-29.9, adult: Secondary | ICD-10-CM | POA: Diagnosis not present

## 2017-10-29 DIAGNOSIS — R05 Cough: Secondary | ICD-10-CM | POA: Diagnosis not present

## 2017-10-29 DIAGNOSIS — J181 Lobar pneumonia, unspecified organism: Secondary | ICD-10-CM | POA: Diagnosis not present

## 2017-11-06 DIAGNOSIS — Z6829 Body mass index (BMI) 29.0-29.9, adult: Secondary | ICD-10-CM | POA: Diagnosis not present

## 2017-11-06 DIAGNOSIS — J181 Lobar pneumonia, unspecified organism: Secondary | ICD-10-CM | POA: Diagnosis not present

## 2017-11-06 DIAGNOSIS — R5381 Other malaise: Secondary | ICD-10-CM | POA: Diagnosis not present

## 2017-11-20 DIAGNOSIS — R1310 Dysphagia, unspecified: Secondary | ICD-10-CM | POA: Diagnosis not present

## 2017-11-20 DIAGNOSIS — K219 Gastro-esophageal reflux disease without esophagitis: Secondary | ICD-10-CM | POA: Diagnosis not present

## 2017-12-01 DIAGNOSIS — R5381 Other malaise: Secondary | ICD-10-CM | POA: Diagnosis not present

## 2017-12-01 DIAGNOSIS — J45998 Other asthma: Secondary | ICD-10-CM | POA: Diagnosis not present

## 2017-12-01 DIAGNOSIS — R05 Cough: Secondary | ICD-10-CM | POA: Diagnosis not present

## 2017-12-01 DIAGNOSIS — Z6829 Body mass index (BMI) 29.0-29.9, adult: Secondary | ICD-10-CM | POA: Diagnosis not present

## 2017-12-23 DIAGNOSIS — L821 Other seborrheic keratosis: Secondary | ICD-10-CM | POA: Diagnosis not present

## 2017-12-23 DIAGNOSIS — D225 Melanocytic nevi of trunk: Secondary | ICD-10-CM | POA: Diagnosis not present

## 2017-12-23 DIAGNOSIS — L720 Epidermal cyst: Secondary | ICD-10-CM | POA: Diagnosis not present

## 2018-01-07 ENCOUNTER — Ambulatory Visit (INDEPENDENT_AMBULATORY_CARE_PROVIDER_SITE_OTHER): Payer: Medicare Other | Admitting: Pulmonary Disease

## 2018-01-07 ENCOUNTER — Encounter: Payer: Self-pay | Admitting: Pulmonary Disease

## 2018-01-07 ENCOUNTER — Other Ambulatory Visit (INDEPENDENT_AMBULATORY_CARE_PROVIDER_SITE_OTHER): Payer: Medicare Other

## 2018-01-07 ENCOUNTER — Telehealth: Payer: Self-pay

## 2018-01-07 VITALS — BP 126/68 | HR 91 | Ht 74.5 in | Wt 248.0 lb

## 2018-01-07 DIAGNOSIS — R0602 Shortness of breath: Secondary | ICD-10-CM

## 2018-01-07 DIAGNOSIS — J45909 Unspecified asthma, uncomplicated: Secondary | ICD-10-CM | POA: Diagnosis not present

## 2018-01-07 LAB — CBC WITH DIFFERENTIAL/PLATELET
BASOS ABS: 0.1 10*3/uL (ref 0.0–0.1)
BASOS PCT: 1.1 % (ref 0.0–3.0)
EOS ABS: 0.7 10*3/uL (ref 0.0–0.7)
Eosinophils Relative: 11.2 % — ABNORMAL HIGH (ref 0.0–5.0)
HEMATOCRIT: 43.8 % (ref 39.0–52.0)
HEMOGLOBIN: 15.1 g/dL (ref 13.0–17.0)
Lymphocytes Relative: 25.5 % (ref 12.0–46.0)
Lymphs Abs: 1.5 10*3/uL (ref 0.7–4.0)
MCHC: 34.4 g/dL (ref 30.0–36.0)
MCV: 92.1 fl (ref 78.0–100.0)
Monocytes Absolute: 0.7 10*3/uL (ref 0.1–1.0)
Monocytes Relative: 11.4 % (ref 3.0–12.0)
Neutro Abs: 3 10*3/uL (ref 1.4–7.7)
Neutrophils Relative %: 50.8 % (ref 43.0–77.0)
Platelets: 196 10*3/uL (ref 150.0–400.0)
RBC: 4.76 Mil/uL (ref 4.22–5.81)
RDW: 13.1 % (ref 11.5–15.5)
WBC: 5.9 10*3/uL (ref 4.0–10.5)

## 2018-01-07 LAB — NITRIC OXIDE: NITRIC OXIDE: 83

## 2018-01-07 MED ORDER — IPRATROPIUM-ALBUTEROL 0.5-2.5 (3) MG/3ML IN SOLN: 3.0000 mL | Freq: Four times a day (QID) | RESPIRATORY_TRACT | 11 refills | Status: DC | PRN

## 2018-01-07 MED ORDER — AZELASTINE HCL 0.1 % NA SOLN: 1.0000 | Freq: Two times a day (BID) | NASAL | 0 refills | Status: DC

## 2018-01-07 MED ORDER — MONTELUKAST SODIUM 10 MG PO TABS: 10.0000 mg | ORAL_TABLET | Freq: Every day | ORAL | 0 refills | Status: DC

## 2018-01-07 MED ORDER — PREDNISONE 10 MG PO TABS: ORAL_TABLET | ORAL | 0 refills | Status: DC

## 2018-01-07 MED ORDER — OMEPRAZOLE 40 MG PO CPDR: 40.0000 mg | DELAYED_RELEASE_CAPSULE | Freq: Two times a day (BID) | ORAL | 0 refills | Status: DC

## 2018-01-07 MED ORDER — CHLORPHENIRAMINE MALEATE 4 MG PO TABS: 8.0000 mg | ORAL_TABLET | Freq: Three times a day (TID) | ORAL | 0 refills | Status: AC

## 2018-01-07 NOTE — Patient Instructions (Addendum)
We will start on chlorpheniramine 8 mg 3 times daily, Astelin nasal spray.  Continue Flonase We will start you on an antiallergy pill called Singulair Increase Prilosec to 40 mg twice daily Continue Symbicort.  Will start you on nebulizer treatment with DuoNeb's as needed We will give you prednisone taper starting at 40 mg.  Reduce dose by 10 mg every 4 days Check test including CBC, blood allergy profile and pulmonary function tests Follow-up in 1 month.

## 2018-01-07 NOTE — Telephone Encounter (Signed)
Per Dr. Vaughan Browner- cbc shows that Eosinophils are high which could indicate something more then asthma. Order CT high res to evaluate further and hold prednisone until CT is done. lmtcb x1 for. CT is pending until I speak with pt.

## 2018-01-07 NOTE — Progress Notes (Signed)
Darin Harrington    [id number]    [birth year]  Primary Care Physician:Tisovec, Fransico Him, MD  Referring Physician: Haywood Pao, MD [address] [place], [place] [address]  Chief complaint: Consult for dyspnea, wheeze  HPI: 67 year old with past medical history of brain tumor status post resection, diabetes Complains of dyspnea with cough, wheezing.  No mucus production, fevers, chills.  Has had respiratory issues since January 2019.  Initial chest x-ray showed left lower lobe opacity.  Treated with 2 courses of antibiotic and prednisone without any improvement.  He had a chest x-ray in December 01, 2017 which per report does not show any acute process.  Started on Symbicort 160/4.5 with no change in symptoms he is also taking Flonase nasal spray, guaifenesin  Seen by ENT in 2018 with CT sinus that shows no abnormality.  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 Exposures: No known exposures, no mold, dampness, hot tub, Jacuzzi Smoking history: Minimal smoking history as a teenager  Travel History: Lived in [place] as a child.  No significant travel history.  Outpatient Encounter Medications as of 01/07/2018  Medication Sig  . acetaminophen (TYLENOL) 325 MG tablet Take 650 mg by mouth every 6 (six) hours as needed.  . Cholecalciferol (VITAMIN D-1000 MAX ST) 1000 units tablet Take by mouth.  . escitalopram (LEXAPRO) 20 MG tablet Take by mouth.  . fluticasone (FLONASE) 50 MCG/ACT nasal spray SPRAY 2 SPRAYS INTO EACH NOSTRIL EVERY DAY  . levETIRAcetam (KEPPRA) 500 MG tablet TAKE 1 TABLET TWICE A DAY  . losartan (COZAAR) 25 MG tablet TAKE 1 TABLET BY MOUTH EVERY DAY  . metFORMIN (GLUCOPHAGE) 500 MG tablet Take 500 mg by mouth See admin instructions. Take 500mg  with breakfast and lunch every day.  Take 500mg  at dinner time as needed if eats a Large dinner  . omeprazole (PRILOSEC) 40 MG capsule Take by mouth daily.    . Probiotic Product (PROBIOTIC-10 PO) Take by mouth.  . SYMBICORT 160-4.5 MCG/ACT inhaler   . [DISCONTINUED] aspirin EC 81 MG tablet Take 81 mg by mouth daily.  . [DISCONTINUED] dexamethasone (DECADRON) 2 MG tablet Take 1 mg by mouth 2 (two) times daily.  . [DISCONTINUED] levETIRAcetam (KEPPRA) 500 MG tablet Take 1 tablet (500 mg total) by mouth 2 (two) times daily.   No facility-administered encounter medications on file as of 01/07/2018.     Allergies as of 01/07/2018  . (No Known Allergies)    Past Medical History:  Diagnosis Date  . Allergic rhinitis   . Diabetes mellitus without complication ([place])    TYPE 2 WELL CONTROLED  . Headache(784.0)   . History of brain tumor   . Hyperlipidemia   . Pneumothorax, spontaneous, tension   . Seizures ([place]) 11/04/2013    " MY FIRST SEIZURE EVER "  . Stroke [facility])     Past Surgical History:  Procedure Laterality Date  . CARDIAC CATHETERIZATION     normal coronary arteries  . CATARACT EXTRACTION    . POPLITEAL SYNOVIAL CYST EXCISION    . TONSILLECTOMY      Family History  Problem Relation Age of Onset  . Alzheimer's disease Father     Social History   Socioeconomic History  . Marital status: Married    Spouse name: Not on file  . Number of children: Not on file  . Years of education: Not on file  . Highest  education level: Not on file  Occupational History  . Not on file  Social Needs  . Financial resource strain: Not on file  . Food insecurity:    Worry: Not on file    Inability: Not on file  . Transportation needs:    Medical: Not on file    Non-medical: Not on file  Tobacco Use  . Smoking status: Never Smoker  . Smokeless tobacco: Never Used  Substance and Sexual Activity  . Alcohol use: No    Comment: occasional wine  . Drug use: No  . Sexual activity: Not on file  Lifestyle  . Physical activity:    Days per week: Not on file    Minutes per session: Not on file  . Stress: Not on file  Relationships  .  Social connections:    Talks on phone: Not on file    Gets together: Not on file    Attends religious service: Not on file    Active member of club or organization: Not on file    Attends meetings of clubs or organizations: Not on file    Relationship status: Not on file  . Intimate partner violence:    Fear of current or ex partner: Not on file    Emotionally abused: Not on file    Physically abused: Not on file    Forced sexual activity: Not on file  Other Topics Concern  . Not on file  Social History Narrative  . Not on file    Review of systems: Review of Systems  Constitutional: Negative for fever and chills.  HENT: Negative.   Eyes: Negative for blurred vision.  Respiratory: as per HPI  Cardiovascular: Negative for chest pain and palpitations.  Gastrointestinal: Negative for vomiting, diarrhea, blood per rectum. Genitourinary: Negative for dysuria, urgency, frequency and hematuria.  Musculoskeletal: Negative for myalgias, back pain and joint pain.  Skin: Negative for itching and rash.  Neurological: Negative for dizziness, tremors, focal weakness, seizures and loss of consciousness.  Endo/Heme/Allergies: Negative for environmental allergies.  Psychiatric/Behavioral: Negative for depression, suicidal ideas and hallucinations.  All other systems reviewed and are negative.  Physical Exam: Blood pressure 126/68, pulse 91, height 6' 2.5" (1.892 m), weight 248 lb (112.5 kg), SpO2 96 %. Gen:      No acute distress HEENT:  EOMI, sclera anicteric Neck:     No masses; no thyromegaly Lungs:    Expiratory wheeze, crackles CV:         Regular rate and rhythm; no murmurs Abd:      + bowel sounds; soft, non-tender; no palpable masses, no distension Ext:    No edema; adequate peripheral perfusion Skin:      Warm and dry; no rash Neuro: alert and oriented x 3 Psych: normal mood and affect  Data Reviewed: FENO 01/07/18-83  CT sinus 05/21/17- normal  Assessment:  Assessment for  dyspnea, wheeze Likely has reactive airway disease, asthma given persistence of symptoms after his pneumonia in January, elevated FENO Postnasal drip and silent reflux may be contributing to ongoing symptoms We will continue Symbicort and give a prednisone taper has been he is wheezing on examination Start nebulizers as needed Start singular Check CBC differential, blood allergy profile.  He will need PFTs when he is more stable.  Start chlorpheniramine, Astelin nasal spray for postnasal drip.  Continue Flonase.  Increase Prilosec to 40 mg twice daily to help with GERD  Plan/Recommendations: - Continue Symbicort, add nebulizers - Chlorphentermine, Astelin, Flonase, Singulair -  Increase Prilosec to 40 twice daily  -Prednisone taper - Check CBC differential, blood allergy profile - Obtain x-ray report from primary care office  Marshell Garfinkel MD [facility] 01/07/2018, 4:02 PM  CC: Tisovec, Fransico Him, MD

## 2018-01-08 ENCOUNTER — Telehealth: Payer: Self-pay | Admitting: Pulmonary Disease

## 2018-01-08 ENCOUNTER — Ambulatory Visit (INDEPENDENT_AMBULATORY_CARE_PROVIDER_SITE_OTHER)
Admission: RE | Admit: 2018-01-08 | Discharge: 2018-01-08 | Disposition: A | Discharge status: Home / Self Care | Payer: Medicare Other | Source: Ambulatory Visit | Attending: Pulmonary Disease | Admitting: Pulmonary Disease

## 2018-01-08 DIAGNOSIS — R05 Cough: Secondary | ICD-10-CM | POA: Diagnosis not present

## 2018-01-08 DIAGNOSIS — R0602 Shortness of breath: Secondary | ICD-10-CM | POA: Diagnosis not present

## 2018-01-08 LAB — RESPIRATORY ALLERGY PROFILE REGION II ~~LOC~~
Allergen, Cedar tree, t12: 0.24 kU/L — ABNORMAL HIGH
Allergen, D pternoyssinus,d7: 0.23 kU/L — ABNORMAL HIGH
Allergen, Mouse Urine Protein, e78: <0.1 kU/L
Allergen, Mulberry, t76: <0.1 kU/L
Allergen, P. notatum, m1: <0.1 kU/L
Aspergillus fumigatus, m3: <0.1 kU/L
BERMUDA GRASS: 2.81 kU/L — AB
BOX ELDER: 0.33 kU/L — AB
CLADOSPORIUM HERBARUM (M2) IGE: <0.1 kU/L
CLASS: 0
CLASS: 0
CLASS: 0
CLASS: 0
CLASS: 0
CLASS: 2
CLASS: 3
COMMON RAGWEED (SHORT) (W1) IGE: 0.16 kU/L — ABNORMAL HIGH
Cat Dander: <0.1 kU/L
Class: 0
Class: 0
Class: 0
Class: 0
Class: 0
Class: 0
Class: 0
Class: 0
Class: 0
Class: 0
Class: 0
Class: 0
Class: 0
Class: 0
Class: 0
Class: 1
Class: 2
D. farinae: 0.37 kU/L — ABNORMAL HIGH
IGE (IMMUNOGLOBULIN E), SERUM: 32 kU/L (ref <=114)
JOHNSON GRASS: 2.01 kU/L — AB
PECAN/HICKORY TREE IGE: 0.23 kU/L — AB
Rough Pigweed  IgE: <0.1 kU/L
Timothy Grass: 7.22 kU/L — ABNORMAL HIGH

## 2018-01-08 LAB — INTERPRETATION:

## 2018-01-08 MED ORDER — PREDNISONE 10 MG PO TABS: ORAL_TABLET | ORAL | 0 refills | Status: DC

## 2018-01-08 MED ORDER — IPRATROPIUM-ALBUTEROL 0.5-2.5 (3) MG/3ML IN SOLN: 3.0000 mL | Freq: Four times a day (QID) | RESPIRATORY_TRACT | 11 refills | Status: DC

## 2018-01-08 NOTE — Telephone Encounter (Signed)
Per Lesleigh Noe, CMA - she spoke with Dr. Vaughan Browner and was advised because pt had already started the prednisone he would like to postpone HRCT until after May follow up. Pt will need to get CXR in the meantime. Called and spoke to pt's wife, Shana Chute, regarding change. She verbalized understanding and denied any further questions or concerns at this time.

## 2018-01-08 NOTE — Telephone Encounter (Signed)
Called and spoke to pt's wife. She states the prednisone was sent to pharmacy differently then what was written on AVS, pt has already started taking prednisone. Called pharmacy and advised it will need 10 additional tabs as prescribed per Dr. Vaughan Browner.   Also spoke with [facility] about pt's HRCT and was advised that Dr Vaughan Browner would like a CXR instead of a HRCT because pt has already started his pred. Shearin is aware to have pt come in for CXR as soon as they are available. Shearin is aware to have pt keep May follow up. Nothing further needed at this time. Will sign off.

## 2018-01-08 NOTE — Addendum Note (Signed)
Addended by: Maryanna Shape A on: 01/08/2018 10:32 AM   Modules accepted: Orders

## 2018-01-08 NOTE — Addendum Note (Signed)
Addended by: Collier Salina on: 01/08/2018 10:39 AM   Modules accepted: Orders

## 2018-01-08 NOTE — Telephone Encounter (Signed)
Pt returned call. Informed him of the results and recs per PM. Order placed. Pt verbalized understanding and denied any further questions or concerns at this time.

## 2018-01-08 NOTE — Telephone Encounter (Signed)
Per Dr. Vaughan Browner okay to order as scheduled every 6 hours but make pt aware to take PRN. lmtcb to make pt aware.  Rx for Duoneb q6h has been given to [facility].

## 2018-01-08 NOTE — Telephone Encounter (Signed)
Called and spoke to Wasta at [facility]. She stated that for the medication, Duoneb, to be covered by insurance needs to written as a scheduled med but can also be PRN. Currently it is only written as PRN.   Dr. Vaughan Browner please advise.

## 2018-01-09 MED ORDER — IPRATROPIUM-ALBUTEROL 0.5-2.5 (3) MG/3ML IN SOLN: 3.0000 mL | Freq: Four times a day (QID) | RESPIRATORY_TRACT | 11 refills | Status: AC | PRN

## 2018-01-09 NOTE — Telephone Encounter (Signed)
Ivin Booty, pt's wife, requesting Rx for DuoNeb w/ specific instructions be sent to [facility] Phone Number [phone]. Per Ivin Booty, [facility] pharm told her they need more specific dosing instruction in order to fill and send out this medication. Ivin Booty Cb is [phone] or [phone].

## 2018-01-09 NOTE — Telephone Encounter (Signed)
Spoke with Darin Harrington and advised Rx for duoneb was sent to [facility] pharmacy. Nothing further is needed.

## 2018-01-15 ENCOUNTER — Telehealth: Payer: Self-pay | Admitting: Pulmonary Disease

## 2018-01-15 NOTE — Progress Notes (Signed)
Pt aware per 01/15/18 phone note

## 2018-01-15 NOTE — Telephone Encounter (Signed)
Result Notes for DG Chest 2 View  Notes recorded by Jannette Spanner, CMA on 01/14/2018 at 9:04 AM EDT ATC pt, no answer. Left message for pt to call back.  ------  Notes recorded by Marshell Garfinkel, MD on 01/14/2018 at 3:13 AM EDT Chest x ray shows his lungs are not fully expanded at the base. There are no acute lung findings   Called spoke with patient, discussed cxr results as stated by Dr Vaughan Browner Pt voiced his understanding and denied any questions/concerns  Pt did note that his cough is improved since last office visit - advised patient to continue recommendations from last ov w/ Dr Vaughan Browner and to please call the office if his cough does not continue to improve or it begins to worsen.  Also advised pt to keep his upcoming 5.14.19 PFT and ov w/ Dr Vaughan Browner  Nothing further needed; will sign off

## 2018-01-20 ENCOUNTER — Other Ambulatory Visit: Payer: Managed Care, Other (non HMO)

## 2018-01-29 ENCOUNTER — Other Ambulatory Visit: Payer: Self-pay | Admitting: Pulmonary Disease

## 2018-02-10 ENCOUNTER — Other Ambulatory Visit: Payer: Self-pay | Admitting: Pulmonary Disease

## 2018-02-10 ENCOUNTER — Ambulatory Visit: Payer: Commercial Indemnity | Admitting: Pulmonary Disease

## 2018-02-15 ENCOUNTER — Other Ambulatory Visit: Payer: Self-pay | Admitting: Pulmonary Disease

## 2018-02-17 ENCOUNTER — Other Ambulatory Visit: Payer: Self-pay | Admitting: Adult Health

## 2018-02-17 ENCOUNTER — Encounter: Payer: Self-pay | Admitting: Adult Health

## 2018-02-17 ENCOUNTER — Ambulatory Visit (INDEPENDENT_AMBULATORY_CARE_PROVIDER_SITE_OTHER): Payer: Medicare Other | Admitting: Adult Health

## 2018-02-17 ENCOUNTER — Ambulatory Visit (INDEPENDENT_AMBULATORY_CARE_PROVIDER_SITE_OTHER): Payer: Medicare Other | Admitting: Pulmonary Disease

## 2018-02-17 DIAGNOSIS — J310 Chronic rhinitis: Secondary | ICD-10-CM | POA: Diagnosis not present

## 2018-02-17 DIAGNOSIS — J4530 Mild persistent asthma, uncomplicated: Secondary | ICD-10-CM

## 2018-02-17 DIAGNOSIS — R0602 Shortness of breath: Secondary | ICD-10-CM | POA: Diagnosis not present

## 2018-02-17 DIAGNOSIS — K219 Gastro-esophageal reflux disease without esophagitis: Secondary | ICD-10-CM | POA: Diagnosis not present

## 2018-02-17 DIAGNOSIS — J45909 Unspecified asthma, uncomplicated: Secondary | ICD-10-CM | POA: Insufficient documentation

## 2018-02-17 LAB — PULMONARY FUNCTION TEST
DL/VA % PRED: 93 %
DL/VA: 4.53 ml/min/mmHg/L
DLCO COR % PRED: 91 %
DLCO COR: 34.66 ml/min/mmHg
DLCO UNC % PRED: 93 %
DLCO unc: 35.14 ml/min/mmHg
FEF 25-75 PRE: 3.17 L/s
FEF 25-75 Post: 3.83 L/sec
FEF2575-%CHANGE-POST: 20 %
FEF2575-%PRED-PRE: 106 %
FEF2575-%Pred-Post: 128 %
FEV1-%Change-Post: 7 %
FEV1-%Pred-Post: 91 %
FEV1-%Pred-Pre: 84 %
FEV1-Post: 3.51 L
FEV1-Pre: 3.27 L
FEV1FVC-%CHANGE-POST: 0 %
FEV1FVC-%Pred-Pre: 105 %
FEV6-%Change-Post: 8 %
FEV6-%Pred-Post: 90 %
FEV6-%Pred-Pre: 83 %
FEV6-PRE: 4.11 L
FEV6-Post: 4.46 L
FEV6FVC-%Change-Post: -1 %
FEV6FVC-%Pred-Post: 103 %
FEV6FVC-%Pred-Pre: 105 %
FVC-%Change-Post: 8 %
FVC-%PRED-PRE: 80 %
FVC-%Pred-Post: 86 %
FVC-Post: 4.52 L
FVC-Pre: 4.18 L
POST FEV1/FVC RATIO: 78 %
Post FEV6/FVC ratio: 99 %
Pre FEV1/FVC ratio: 78 %
Pre FEV6/FVC Ratio: 100 %
RV % pred: 141 %
RV: 3.71 L
TLC % pred: 106 %
TLC: 8.34 L

## 2018-02-17 NOTE — Progress Notes (Signed)
@Patient  ID: Darin Harrington, male    DOB: 10-10-1950, 67 y.o.   MRN: [id number]  No chief complaint on file.   Referring provider: Haywood Pao, MD  HPI: 67 year old male former smoker seen for pulmonary consult January 07, 2018 for ongoing cough and wheezing felt to have underlying asthma/reactive airways status post pneumonia in January 2019 Past medical history significant for previous brain mass status post resection and previous stroke  02/17/2018 Follow up : Asthma  Patient returns for one-month follow-up.  Patient was last seen for pulmonary consult after persistent cough and wheezing after he was diagnosed with pneumonia in January 2019.  Despite being treated with antibiotics and steroids along with inhalers.  Patient continued to have cough and wheezing.  Exhaled nitric oxide testing was elevated at 83.  Chest  x-ray showed mild left basilar atelectasis.  PFT today showed normal lung function with FEV1 at 91%, ratio 78, FVC 86% no significant bronchodilator response.  Marland Kitchen  Positive mid flow reversibility, DLCO 93%. Allergy testing showed positivity triggers to dust, trees and grass.  CBC showed elevated eosinophils.   patient has been taking Symbicort, Chlor-Trimeton Singulair along with Flonase.  Does feel that his symptoms are slowly improving.  Had stopped Symbicort for a few days but felt that his cough and wheezing returns to once he restarted Symbicort felt that it really helped. Says it Chlor-Trimeton is making him sleepy during the daytime   No Known Allergies  Immunization History  Administered Date(s) Administered  . Influenza, High Dose Seasonal PF 06/23/2017    Past Medical History:  Diagnosis Date  . Allergic rhinitis   . Diabetes mellitus without complication ([place])    TYPE 2 WELL CONTROLED  . Headache(784.0)   . History of brain tumor   . Hyperlipidemia   . Pneumothorax, spontaneous, tension   . Seizures ([place]) 11/04/2013    " MY FIRST SEIZURE EVER "  .  Stroke [facility])     Tobacco History: Social History   Tobacco Use  Smoking Status Former Smoker  . Packs/day: 1.50  . Types: Cigarettes  . Last attempt to quit: 1980  . Years since quitting: 39.4  Smokeless Tobacco Never Used   Counseling given: Not Answered   Outpatient Encounter Medications as of 02/17/2018  Medication Sig  . acetaminophen (TYLENOL) 325 MG tablet Take 650 mg by mouth every 6 (six) hours as needed.  . chlorpheniramine (CHLOR-TRIMETON) 4 MG tablet Take 2 tablets (8 mg total) by mouth 3 (three) times daily.  . Cholecalciferol (VITAMIN D-1000 MAX ST) 1000 units tablet Take by mouth.  . escitalopram (LEXAPRO) 20 MG tablet Take by mouth.  . fluticasone (FLONASE) 50 MCG/ACT nasal spray SPRAY 2 SPRAYS INTO EACH NOSTRIL EVERY DAY  . ipratropium-albuterol (DUONEB) 0.5-2.5 (3) MG/3ML SOLN Take 3 mLs by nebulization every 6 (six) hours as needed. Dx: D32.671  . levETIRAcetam (KEPPRA) 500 MG tablet TAKE 1 TABLET TWICE A DAY  . losartan (COZAAR) 25 MG tablet TAKE 1 TABLET BY MOUTH EVERY DAY  . metFORMIN (GLUCOPHAGE) 500 MG tablet Take 500 mg by mouth See admin instructions. Take 500mg  with breakfast and lunch every day.  Take 500mg  at dinner time as needed if eats a Large dinner  . montelukast (SINGULAIR) 10 MG tablet TAKE 1 TABLET BY MOUTH EVERYDAY AT BEDTIME  . omeprazole (PRILOSEC) 40 MG capsule TAKE 1 CAPSULE BY MOUTH TWICE A DAY  . Probiotic Product (PROBIOTIC-10 PO) Take by mouth.  . SYMBICORT 160-4.5 MCG/ACT inhaler   . [  DISCONTINUED] azelastine (ASTELIN) 0.1 % nasal spray SPRAY ONCE INTO EACH NOSTRIL TWICE A DAY AS DIRECTED (Patient not taking: Reported on 02/17/2018)  . [DISCONTINUED] predniSONE (DELTASONE) 10 MG tablet 4 tabs x 4 days, 3 tabs x 4 days, 2 tabs x 4 days, 1 tab x 4 days then stop (Patient not taking: Reported on 02/17/2018)   No facility-administered encounter medications on file as of 02/17/2018.      Review of Systems  Constitutional:   No  weight  loss, night sweats,  Fevers, chills, fatigue, or  lassitude.  HEENT:   No headaches,  Difficulty swallowing,  Tooth/dental problems, or  Sore throat,                No sneezing, itching, ear ache,  +nasal congestion, post nasal drip,   CV:  No chest pain,  Orthopnea, PND, swelling in lower extremities, anasarca, dizziness, palpitations, syncope.   GI  No heartburn, indigestion, abdominal pain, nausea, vomiting, diarrhea, change in bowel habits, loss of appetite, bloody stools.   Resp:  No chest wall deformity  Skin: no rash or lesions.  GU: no dysuria, change in color of urine, no urgency or frequency.  No flank pain, no hematuria   MS:  No joint pain or swelling.  No decreased range of motion.  No back pain.    Physical Exam  BP 130/74 (BP Location: Right Arm, Cuff Size: Normal)   Pulse 86   Ht 6\' 2"  (1.88 m)   Wt 251 lb (113.9 kg)   SpO2 96%   BMI 32.23 kg/m   GEN: A/Ox3; pleasant , NAD, obese, cane   HEENT:  [place]/AT,  EACs-clear, TMs-wnl, NOSE-clear  THROAT-clear, no lesions, no postnasal drip or exudate noted.   NECK:  Supple w/ fair ROM; no JVD; normal carotid impulses w/o bruits; no thyromegaly or nodules palpated; no lymphadenopathy.    RESP  Clear  P & A; w/o, wheezes/ rales/ or rhonchi. no accessory muscle use, no dullness to percussion  CARD:  RRR, no m/r/g, no peripheral edema, pulses intact, no cyanosis or clubbing.  GI:   Soft & nt; nml bowel sounds; no organomegaly or masses detected.   Musco: Warm bil, no deformities or joint swelling noted.   Neuro: alert, no focal deficits noted.    Skin: Warm, no lesions or rashes    Lab Results:  CBC    Component Value Date/Time   WBC 5.9 01/07/2018 1645   RBC 4.76 01/07/2018 1645   HGB 15.1 01/07/2018 1645   HCT 43.8 01/07/2018 1645   PLT 196.0 01/07/2018 1645   MCV 92.1 01/07/2018 1645   MCH 32.0 11/04/2013 0401   MCHC 34.4 01/07/2018 1645   RDW 13.1 01/07/2018 1645   LYMPHSABS 1.5 01/07/2018 1645    MONOABS 0.7 01/07/2018 1645   EOSABS 0.7 01/07/2018 1645   BASOSABS 0.1 01/07/2018 1645    BMET    Component Value Date/Time   NA 141 11/04/2013 0401   K 4.2 11/04/2013 0401   CL 105 11/04/2013 0401   CO2 23 11/04/2013 0401   GLUCOSE 158 (H) 11/04/2013 0401   BUN 15 11/04/2013 0401   CREATININE 0.85 11/04/2013 0401   CALCIUM 8.8 11/04/2013 0401   GFRNONAA >90 11/04/2013 0401   GFRAA >90 11/04/2013 0401    BNP No results found for: BNP  ProBNP No results found for: PROBNP  Imaging: No results found.   Assessment & Plan:   Asthma Suspect patient has a component of  asthma as exhaled nitric oxide testing was elevated along with eosinophils and allergy profile.  Patient has clinical improvement with Symbicort Singulair and Flonase.  Plan  Patient Instructions  Continue on Symbicort 2 puffs Twice daily  , rinse after use .  Continue on Flonase 2 puffs daily  Continue on Singulair .At bedtime   Add Allegra daily in am .  Continue on Chlortrimeton 4mg  2 At bedtime  .  Continue on Prilosec 20mg  Twice daily  .  Follow up Dr. Vaughan Browner in  2-3 months and As needed        Chronic rhinitis Control for triggers  Plan  Patient Instructions  Continue on Symbicort 2 puffs Twice daily  , rinse after use .  Continue on Flonase 2 puffs daily  Continue on Singulair .At bedtime   Add Allegra daily in am .  Continue on Chlortrimeton 4mg  2 At bedtime  .  Continue on Prilosec 20mg  Twice daily  .  Follow up Dr. Vaughan Browner in  2-3 months and As needed        GERD (gastroesophageal reflux disease) Cont on GERD diet and PPI Twice daily       Rexene Edison, NP 02/17/2018

## 2018-02-17 NOTE — Patient Instructions (Signed)
Continue on Symbicort 2 puffs Twice daily  , rinse after use .  Continue on Flonase 2 puffs daily  Continue on Singulair .At bedtime   Add Allegra daily in am .  Continue on Chlortrimeton 4mg  2 At bedtime  .  Continue on Prilosec 20mg  Twice daily  .  Follow up Dr. Vaughan Browner in  2-3 months and As needed

## 2018-02-17 NOTE — Assessment & Plan Note (Signed)
Suspect patient has a component of asthma as exhaled nitric oxide testing was elevated along with eosinophils and allergy profile.  Patient has clinical improvement with Symbicort Singulair and Flonase.  Plan  Patient Instructions  Continue on Symbicort 2 puffs Twice daily  , rinse after use .  Continue on Flonase 2 puffs daily  Continue on Singulair .At bedtime   Add Allegra daily in am .  Continue on Chlortrimeton 4mg  2 At bedtime  .  Continue on Prilosec 20mg  Twice daily  .  Follow up Dr. Vaughan Browner in  2-3 months and As needed

## 2018-02-17 NOTE — Progress Notes (Signed)
Patient completed full PFT today. 

## 2018-02-17 NOTE — Telephone Encounter (Signed)
Pt seen today by TP Per TP after patient left, needs refill on Singulair and Prilosec BID to [facility]  [facility] is not in patient's chart but [facility] is [facility] TCB x1 to verify what mail-order pharmacy we need to send his meds to

## 2018-02-17 NOTE — Assessment & Plan Note (Signed)
Cont on GERD diet and PPI Twice daily

## 2018-02-17 NOTE — Assessment & Plan Note (Signed)
Control for triggers  Plan  Patient Instructions  Continue on Symbicort 2 puffs Twice daily  , rinse after use .  Continue on Flonase 2 puffs daily  Continue on Singulair .At bedtime   Add Allegra daily in am .  Continue on Chlortrimeton 4mg  2 At bedtime  .  Continue on Prilosec 20mg  Twice daily  .  Follow up Dr. Vaughan Browner in  2-3 months and As needed

## 2018-02-25 DIAGNOSIS — Z125 Encounter for screening for malignant neoplasm of prostate: Secondary | ICD-10-CM | POA: Diagnosis not present

## 2018-02-25 DIAGNOSIS — E1129 Type 2 diabetes mellitus with other diabetic kidney complication: Secondary | ICD-10-CM | POA: Diagnosis not present

## 2018-02-25 DIAGNOSIS — E7849 Other hyperlipidemia: Secondary | ICD-10-CM | POA: Diagnosis not present

## 2018-02-25 DIAGNOSIS — E538 Deficiency of other specified B group vitamins: Secondary | ICD-10-CM | POA: Diagnosis not present

## 2018-02-25 DIAGNOSIS — R82998 Other abnormal findings in urine: Secondary | ICD-10-CM | POA: Diagnosis not present

## 2018-02-26 NOTE — Telephone Encounter (Signed)
ATC pt, no answer and voicemail has not been set up [facility]

## 2018-03-03 DIAGNOSIS — M1990 Unspecified osteoarthritis, unspecified site: Secondary | ICD-10-CM | POA: Diagnosis not present

## 2018-03-03 DIAGNOSIS — D329 Benign neoplasm of meninges, unspecified: Secondary | ICD-10-CM | POA: Diagnosis not present

## 2018-03-03 DIAGNOSIS — N528 Other male erectile dysfunction: Secondary | ICD-10-CM | POA: Diagnosis not present

## 2018-03-03 DIAGNOSIS — G4089 Other seizures: Secondary | ICD-10-CM | POA: Diagnosis not present

## 2018-03-03 DIAGNOSIS — Z6830 Body mass index (BMI) 30.0-30.9, adult: Secondary | ICD-10-CM | POA: Diagnosis not present

## 2018-03-03 DIAGNOSIS — E538 Deficiency of other specified B group vitamins: Secondary | ICD-10-CM | POA: Diagnosis not present

## 2018-03-03 DIAGNOSIS — E7849 Other hyperlipidemia: Secondary | ICD-10-CM | POA: Diagnosis not present

## 2018-03-03 DIAGNOSIS — Z1389 Encounter for screening for other disorder: Secondary | ICD-10-CM | POA: Diagnosis not present

## 2018-03-03 DIAGNOSIS — J4530 Mild persistent asthma, uncomplicated: Secondary | ICD-10-CM | POA: Diagnosis not present

## 2018-03-03 DIAGNOSIS — E1129 Type 2 diabetes mellitus with other diabetic kidney complication: Secondary | ICD-10-CM | POA: Diagnosis not present

## 2018-03-03 DIAGNOSIS — Z0000 Encounter for general adult medical examination without abnormal findings: Secondary | ICD-10-CM | POA: Diagnosis not present

## 2018-03-03 DIAGNOSIS — H9313 Tinnitus, bilateral: Secondary | ICD-10-CM | POA: Diagnosis not present

## 2018-03-04 NOTE — Telephone Encounter (Signed)
ATC x2- unable to leave voicemail, as mailbox has not been set up. Will call back.

## 2018-03-08 ENCOUNTER — Other Ambulatory Visit: Payer: Self-pay | Admitting: Pulmonary Disease

## 2018-03-10 NOTE — Telephone Encounter (Signed)
Refills have already been sent in a separate encounter Will sign off

## 2018-03-11 DIAGNOSIS — R262 Difficulty in walking, not elsewhere classified: Secondary | ICD-10-CM | POA: Diagnosis not present

## 2018-03-11 DIAGNOSIS — M6281 Muscle weakness (generalized): Secondary | ICD-10-CM | POA: Diagnosis not present

## 2018-03-11 DIAGNOSIS — M25552 Pain in left hip: Secondary | ICD-10-CM | POA: Diagnosis not present

## 2018-03-11 DIAGNOSIS — M25551 Pain in right hip: Secondary | ICD-10-CM | POA: Diagnosis not present

## 2018-03-17 DIAGNOSIS — M25552 Pain in left hip: Secondary | ICD-10-CM | POA: Diagnosis not present

## 2018-03-17 DIAGNOSIS — M25551 Pain in right hip: Secondary | ICD-10-CM | POA: Diagnosis not present

## 2018-03-17 DIAGNOSIS — R262 Difficulty in walking, not elsewhere classified: Secondary | ICD-10-CM | POA: Diagnosis not present

## 2018-03-17 DIAGNOSIS — M6281 Muscle weakness (generalized): Secondary | ICD-10-CM | POA: Diagnosis not present

## 2018-03-30 ENCOUNTER — Telehealth: Payer: Self-pay | Admitting: Pulmonary Disease

## 2018-03-30 NOTE — Telephone Encounter (Addendum)
Spoke with pt's wife,  Advised that our billing coordinator will call them about this bill next week.

## 2018-03-30 NOTE — Telephone Encounter (Signed)
Pt wife is calling back [phone]

## 2018-04-01 NOTE — Telephone Encounter (Signed)
Called patients wife, unable to reach. Unable to leave voicemail due to it not being set up.

## 2018-04-06 DIAGNOSIS — M25551 Pain in right hip: Secondary | ICD-10-CM | POA: Diagnosis not present

## 2018-04-06 DIAGNOSIS — M6281 Muscle weakness (generalized): Secondary | ICD-10-CM | POA: Diagnosis not present

## 2018-04-06 DIAGNOSIS — R262 Difficulty in walking, not elsewhere classified: Secondary | ICD-10-CM | POA: Diagnosis not present

## 2018-04-06 DIAGNOSIS — M25552 Pain in left hip: Secondary | ICD-10-CM | POA: Diagnosis not present

## 2018-04-06 NOTE — Telephone Encounter (Signed)
atc pt's spouse, no answer and no vm.  Wcb.

## 2018-04-07 NOTE — Telephone Encounter (Signed)
Instructions   We will start on chlorpheniramine 8 mg 3 times daily, Astelin nasal spray.  Continue Flonase We will start you on an antiallergy pill called Singulair Increase Prilosec to 40 mg twice daily Continue Symbicort.  Will start you on nebulizer treatment with DuoNeb's as needed We will give you prednisone taper starting at 40 mg.  Reduce dose by 10 mg every 4 days Check test including CBC, blood allergy profile and pulmonary function tests Follow-up in 1 month.     Above is the stated instructions from Dr. Vaughan Browner that was on pt's AVS from 01/07/18 that was given to pt and gone over with pt before leaving the office.  Called and spoke with pt's spouse Shana Chute stating to her that due to pt coming in for the consult for SOB and after some of the tests that were done at the office, that was why a blood allergy profile was ordered for pt instead of having pt have the allergy sticks on the back.  Shearin stated to me they were not told before they left the office that pt was going to be having an allergy test performed.  I went over the assessment that was stated by Dr. Vaughan Browner and also reread the AVS to [facility] where it stated on there that the allergy blood profile was going to be tested.  Per Shearin, the bill that they received was around $800 and it is currently being reviewed right now by insurance to see if they will cover it.  Nothing further needed.

## 2018-04-08 DIAGNOSIS — H6692 Otitis media, unspecified, left ear: Secondary | ICD-10-CM | POA: Diagnosis not present

## 2018-04-09 DIAGNOSIS — M6281 Muscle weakness (generalized): Secondary | ICD-10-CM | POA: Diagnosis not present

## 2018-04-09 DIAGNOSIS — R262 Difficulty in walking, not elsewhere classified: Secondary | ICD-10-CM | POA: Diagnosis not present

## 2018-04-09 DIAGNOSIS — M25551 Pain in right hip: Secondary | ICD-10-CM | POA: Diagnosis not present

## 2018-04-09 DIAGNOSIS — M25552 Pain in left hip: Secondary | ICD-10-CM | POA: Diagnosis not present

## 2018-04-16 DIAGNOSIS — R9401 Abnormal electroencephalogram [EEG]: Secondary | ICD-10-CM | POA: Diagnosis not present

## 2018-04-16 DIAGNOSIS — Z79899 Other long term (current) drug therapy: Secondary | ICD-10-CM | POA: Diagnosis not present

## 2018-04-16 DIAGNOSIS — R569 Unspecified convulsions: Secondary | ICD-10-CM | POA: Diagnosis not present

## 2018-04-16 DIAGNOSIS — G40109 Localization-related (focal) (partial) symptomatic epilepsy and epileptic syndromes with simple partial seizures, not intractable, without status epilepticus: Secondary | ICD-10-CM | POA: Diagnosis not present

## 2018-04-17 DIAGNOSIS — Z888 Allergy status to other drugs, medicaments and biological substances status: Secondary | ICD-10-CM | POA: Diagnosis not present

## 2018-04-17 DIAGNOSIS — R251 Tremor, unspecified: Secondary | ICD-10-CM | POA: Diagnosis not present

## 2018-04-17 DIAGNOSIS — R4589 Other symptoms and signs involving emotional state: Secondary | ICD-10-CM | POA: Diagnosis not present

## 2018-04-17 DIAGNOSIS — G40109 Localization-related (focal) (partial) symptomatic epilepsy and epileptic syndromes with simple partial seizures, not intractable, without status epilepticus: Secondary | ICD-10-CM | POA: Diagnosis not present

## 2018-04-17 DIAGNOSIS — D320 Benign neoplasm of cerebral meninges: Secondary | ICD-10-CM | POA: Diagnosis not present

## 2018-04-17 DIAGNOSIS — Z79899 Other long term (current) drug therapy: Secondary | ICD-10-CM | POA: Diagnosis not present

## 2018-04-17 DIAGNOSIS — G40209 Localization-related (focal) (partial) symptomatic epilepsy and epileptic syndromes with complex partial seizures, not intractable, without status epilepticus: Secondary | ICD-10-CM | POA: Diagnosis not present

## 2018-04-17 DIAGNOSIS — Z86011 Personal history of benign neoplasm of the brain: Secondary | ICD-10-CM | POA: Diagnosis not present

## 2018-04-17 DIAGNOSIS — Z9889 Other specified postprocedural states: Secondary | ICD-10-CM | POA: Diagnosis not present

## 2018-04-28 DIAGNOSIS — Z8603 Personal history of neoplasm of uncertain behavior: Secondary | ICD-10-CM | POA: Diagnosis not present

## 2018-04-28 DIAGNOSIS — H3589 Other specified retinal disorders: Secondary | ICD-10-CM | POA: Diagnosis not present

## 2018-04-28 DIAGNOSIS — H53431 Sector or arcuate defects, right eye: Secondary | ICD-10-CM | POA: Diagnosis not present

## 2018-04-28 DIAGNOSIS — D320 Benign neoplasm of cerebral meninges: Secondary | ICD-10-CM | POA: Diagnosis not present

## 2018-04-28 DIAGNOSIS — Z9889 Other specified postprocedural states: Secondary | ICD-10-CM | POA: Diagnosis not present

## 2018-04-28 DIAGNOSIS — D420 Neoplasm of uncertain behavior of cerebral meninges: Secondary | ICD-10-CM | POA: Diagnosis not present

## 2018-04-28 DIAGNOSIS — R51 Headache: Secondary | ICD-10-CM | POA: Diagnosis not present

## 2018-04-28 DIAGNOSIS — D329 Benign neoplasm of meninges, unspecified: Secondary | ICD-10-CM | POA: Diagnosis not present

## 2018-04-28 DIAGNOSIS — H468 Other optic neuritis: Secondary | ICD-10-CM | POA: Diagnosis not present

## 2018-04-28 DIAGNOSIS — H47291 Other optic atrophy, right eye: Secondary | ICD-10-CM | POA: Diagnosis not present

## 2018-04-28 DIAGNOSIS — H5461 Unqualified visual loss, right eye, normal vision left eye: Secondary | ICD-10-CM | POA: Diagnosis not present

## 2018-04-28 DIAGNOSIS — R531 Weakness: Secondary | ICD-10-CM | POA: Diagnosis not present

## 2018-04-29 DIAGNOSIS — D2272 Melanocytic nevi of left lower limb, including hip: Secondary | ICD-10-CM | POA: Diagnosis not present

## 2018-05-13 DIAGNOSIS — M6281 Muscle weakness (generalized): Secondary | ICD-10-CM | POA: Diagnosis not present

## 2018-05-13 DIAGNOSIS — R262 Difficulty in walking, not elsewhere classified: Secondary | ICD-10-CM | POA: Diagnosis not present

## 2018-05-13 DIAGNOSIS — M25551 Pain in right hip: Secondary | ICD-10-CM | POA: Diagnosis not present

## 2018-05-13 DIAGNOSIS — M25552 Pain in left hip: Secondary | ICD-10-CM | POA: Diagnosis not present

## 2018-05-18 DIAGNOSIS — M25551 Pain in right hip: Secondary | ICD-10-CM | POA: Diagnosis not present

## 2018-05-18 DIAGNOSIS — R262 Difficulty in walking, not elsewhere classified: Secondary | ICD-10-CM | POA: Diagnosis not present

## 2018-05-18 DIAGNOSIS — M6281 Muscle weakness (generalized): Secondary | ICD-10-CM | POA: Diagnosis not present

## 2018-05-18 DIAGNOSIS — M25552 Pain in left hip: Secondary | ICD-10-CM | POA: Diagnosis not present

## 2018-05-20 DIAGNOSIS — M25551 Pain in right hip: Secondary | ICD-10-CM | POA: Diagnosis not present

## 2018-05-20 DIAGNOSIS — R262 Difficulty in walking, not elsewhere classified: Secondary | ICD-10-CM | POA: Diagnosis not present

## 2018-05-20 DIAGNOSIS — M25552 Pain in left hip: Secondary | ICD-10-CM | POA: Diagnosis not present

## 2018-05-20 DIAGNOSIS — M6281 Muscle weakness (generalized): Secondary | ICD-10-CM | POA: Diagnosis not present

## 2018-05-25 DIAGNOSIS — M25551 Pain in right hip: Secondary | ICD-10-CM | POA: Diagnosis not present

## 2018-05-25 DIAGNOSIS — M25552 Pain in left hip: Secondary | ICD-10-CM | POA: Diagnosis not present

## 2018-05-25 DIAGNOSIS — M6281 Muscle weakness (generalized): Secondary | ICD-10-CM | POA: Diagnosis not present

## 2018-05-25 DIAGNOSIS — R262 Difficulty in walking, not elsewhere classified: Secondary | ICD-10-CM | POA: Diagnosis not present

## 2018-05-27 DIAGNOSIS — M25551 Pain in right hip: Secondary | ICD-10-CM | POA: Diagnosis not present

## 2018-05-27 DIAGNOSIS — M6281 Muscle weakness (generalized): Secondary | ICD-10-CM | POA: Diagnosis not present

## 2018-05-27 DIAGNOSIS — R262 Difficulty in walking, not elsewhere classified: Secondary | ICD-10-CM | POA: Diagnosis not present

## 2018-05-27 DIAGNOSIS — M25552 Pain in left hip: Secondary | ICD-10-CM | POA: Diagnosis not present

## 2018-06-03 DIAGNOSIS — R262 Difficulty in walking, not elsewhere classified: Secondary | ICD-10-CM | POA: Diagnosis not present

## 2018-06-03 DIAGNOSIS — M25552 Pain in left hip: Secondary | ICD-10-CM | POA: Diagnosis not present

## 2018-06-03 DIAGNOSIS — M6281 Muscle weakness (generalized): Secondary | ICD-10-CM | POA: Diagnosis not present

## 2018-06-03 DIAGNOSIS — M25551 Pain in right hip: Secondary | ICD-10-CM | POA: Diagnosis not present

## 2018-06-05 DIAGNOSIS — S61219A Laceration without foreign body of unspecified finger without damage to nail, initial encounter: Secondary | ICD-10-CM | POA: Diagnosis not present

## 2018-06-05 DIAGNOSIS — Z6829 Body mass index (BMI) 29.0-29.9, adult: Secondary | ICD-10-CM | POA: Diagnosis not present

## 2018-06-05 DIAGNOSIS — S61401A Unspecified open wound of right hand, initial encounter: Secondary | ICD-10-CM | POA: Diagnosis not present

## 2018-06-11 DIAGNOSIS — R05 Cough: Secondary | ICD-10-CM | POA: Diagnosis not present

## 2018-06-11 DIAGNOSIS — M79644 Pain in right finger(s): Secondary | ICD-10-CM | POA: Diagnosis not present

## 2018-06-11 DIAGNOSIS — R0781 Pleurodynia: Secondary | ICD-10-CM | POA: Diagnosis not present

## 2018-06-11 DIAGNOSIS — Z6829 Body mass index (BMI) 29.0-29.9, adult: Secondary | ICD-10-CM | POA: Diagnosis not present

## 2018-06-11 DIAGNOSIS — S61219A Laceration without foreign body of unspecified finger without damage to nail, initial encounter: Secondary | ICD-10-CM | POA: Diagnosis not present

## 2018-06-12 DIAGNOSIS — M79644 Pain in right finger(s): Secondary | ICD-10-CM | POA: Insufficient documentation

## 2018-06-12 DIAGNOSIS — S61211A Laceration without foreign body of left index finger without damage to nail, initial encounter: Secondary | ICD-10-CM | POA: Diagnosis not present

## 2018-06-12 DIAGNOSIS — S61218A Laceration without foreign body of other finger without damage to nail, initial encounter: Secondary | ICD-10-CM | POA: Insufficient documentation

## 2018-06-15 DIAGNOSIS — S61210A Laceration without foreign body of right index finger without damage to nail, initial encounter: Secondary | ICD-10-CM | POA: Diagnosis not present

## 2018-06-15 DIAGNOSIS — S61211A Laceration without foreign body of left index finger without damage to nail, initial encounter: Secondary | ICD-10-CM | POA: Diagnosis not present

## 2018-06-15 DIAGNOSIS — S66320A Laceration of extensor muscle, fascia and tendon of right index finger at wrist and hand level, initial encounter: Secondary | ICD-10-CM | POA: Diagnosis not present

## 2018-06-15 DIAGNOSIS — Y999 Unspecified external cause status: Secondary | ICD-10-CM | POA: Diagnosis not present

## 2018-06-15 DIAGNOSIS — X58XXXA Exposure to other specified factors, initial encounter: Secondary | ICD-10-CM | POA: Diagnosis not present

## 2018-06-23 DIAGNOSIS — Z5189 Encounter for other specified aftercare: Secondary | ICD-10-CM | POA: Insufficient documentation

## 2018-07-13 DIAGNOSIS — Z23 Encounter for immunization: Secondary | ICD-10-CM | POA: Diagnosis not present

## 2018-07-13 DIAGNOSIS — M25641 Stiffness of right hand, not elsewhere classified: Secondary | ICD-10-CM | POA: Diagnosis not present

## 2018-07-21 DIAGNOSIS — M25641 Stiffness of right hand, not elsewhere classified: Secondary | ICD-10-CM | POA: Diagnosis not present

## 2018-07-29 DIAGNOSIS — H524 Presbyopia: Secondary | ICD-10-CM | POA: Diagnosis not present

## 2018-07-29 DIAGNOSIS — R9401 Abnormal electroencephalogram [EEG]: Secondary | ICD-10-CM | POA: Diagnosis not present

## 2018-07-29 DIAGNOSIS — Z888 Allergy status to other drugs, medicaments and biological substances status: Secondary | ICD-10-CM | POA: Diagnosis not present

## 2018-07-29 DIAGNOSIS — R569 Unspecified convulsions: Secondary | ICD-10-CM | POA: Diagnosis not present

## 2018-07-31 DIAGNOSIS — M25641 Stiffness of right hand, not elsewhere classified: Secondary | ICD-10-CM | POA: Diagnosis not present

## 2018-08-05 DIAGNOSIS — M25641 Stiffness of right hand, not elsewhere classified: Secondary | ICD-10-CM | POA: Diagnosis not present

## 2018-08-12 DIAGNOSIS — M25641 Stiffness of right hand, not elsewhere classified: Secondary | ICD-10-CM | POA: Diagnosis not present

## 2018-08-18 DIAGNOSIS — M25641 Stiffness of right hand, not elsewhere classified: Secondary | ICD-10-CM | POA: Diagnosis not present

## 2018-08-26 DIAGNOSIS — M25641 Stiffness of right hand, not elsewhere classified: Secondary | ICD-10-CM | POA: Diagnosis not present

## 2018-09-10 DIAGNOSIS — M25641 Stiffness of right hand, not elsewhere classified: Secondary | ICD-10-CM | POA: Diagnosis not present

## 2018-09-17 DIAGNOSIS — M25641 Stiffness of right hand, not elsewhere classified: Secondary | ICD-10-CM | POA: Diagnosis not present

## 2018-10-08 DIAGNOSIS — G4089 Other seizures: Secondary | ICD-10-CM | POA: Diagnosis not present

## 2018-10-08 DIAGNOSIS — N182 Chronic kidney disease, stage 2 (mild): Secondary | ICD-10-CM | POA: Diagnosis not present

## 2018-10-08 DIAGNOSIS — Z79899 Other long term (current) drug therapy: Secondary | ICD-10-CM | POA: Diagnosis not present

## 2018-10-08 DIAGNOSIS — E1129 Type 2 diabetes mellitus with other diabetic kidney complication: Secondary | ICD-10-CM | POA: Diagnosis not present

## 2018-10-08 DIAGNOSIS — D329 Benign neoplasm of meninges, unspecified: Secondary | ICD-10-CM | POA: Diagnosis not present

## 2018-10-08 DIAGNOSIS — F418 Other specified anxiety disorders: Secondary | ICD-10-CM | POA: Diagnosis not present

## 2018-10-08 DIAGNOSIS — F321 Major depressive disorder, single episode, moderate: Secondary | ICD-10-CM | POA: Diagnosis not present

## 2018-10-08 DIAGNOSIS — E7849 Other hyperlipidemia: Secondary | ICD-10-CM | POA: Diagnosis not present

## 2018-10-08 DIAGNOSIS — M1990 Unspecified osteoarthritis, unspecified site: Secondary | ICD-10-CM | POA: Diagnosis not present

## 2018-10-08 DIAGNOSIS — Z6830 Body mass index (BMI) 30.0-30.9, adult: Secondary | ICD-10-CM | POA: Diagnosis not present

## 2018-10-08 DIAGNOSIS — J4530 Mild persistent asthma, uncomplicated: Secondary | ICD-10-CM | POA: Diagnosis not present

## 2018-10-12 ENCOUNTER — Ambulatory Visit (INDEPENDENT_AMBULATORY_CARE_PROVIDER_SITE_OTHER): Payer: Medicare Other | Admitting: Podiatry

## 2018-10-12 ENCOUNTER — Ambulatory Visit: Payer: Medicare Other | Admitting: Orthotics

## 2018-10-12 ENCOUNTER — Encounter: Payer: Self-pay | Admitting: Podiatry

## 2018-10-12 VITALS — BP 104/59

## 2018-10-12 DIAGNOSIS — E1142 Type 2 diabetes mellitus with diabetic polyneuropathy: Secondary | ICD-10-CM

## 2018-10-12 DIAGNOSIS — E669 Obesity, unspecified: Secondary | ICD-10-CM | POA: Insufficient documentation

## 2018-10-12 DIAGNOSIS — E119 Type 2 diabetes mellitus without complications: Secondary | ICD-10-CM | POA: Insufficient documentation

## 2018-10-12 DIAGNOSIS — L84 Corns and callosities: Secondary | ICD-10-CM

## 2018-10-12 DIAGNOSIS — M1990 Unspecified osteoarthritis, unspecified site: Secondary | ICD-10-CM | POA: Insufficient documentation

## 2018-10-12 DIAGNOSIS — J302 Other seasonal allergic rhinitis: Secondary | ICD-10-CM | POA: Insufficient documentation

## 2018-10-12 DIAGNOSIS — B351 Tinea unguium: Secondary | ICD-10-CM | POA: Diagnosis not present

## 2018-10-12 DIAGNOSIS — N529 Male erectile dysfunction, unspecified: Secondary | ICD-10-CM | POA: Insufficient documentation

## 2018-10-12 DIAGNOSIS — H9190 Unspecified hearing loss, unspecified ear: Secondary | ICD-10-CM | POA: Insufficient documentation

## 2018-10-12 DIAGNOSIS — Z9289 Personal history of other medical treatment: Secondary | ICD-10-CM | POA: Insufficient documentation

## 2018-10-12 DIAGNOSIS — H269 Unspecified cataract: Secondary | ICD-10-CM | POA: Insufficient documentation

## 2018-10-12 DIAGNOSIS — H9319 Tinnitus, unspecified ear: Secondary | ICD-10-CM | POA: Insufficient documentation

## 2018-10-12 NOTE — Patient Instructions (Addendum)

## 2018-10-13 ENCOUNTER — Encounter: Payer: Self-pay | Admitting: Podiatry

## 2018-10-13 NOTE — Progress Notes (Signed)

## 2018-10-13 NOTE — Progress Notes (Signed)
Subjective: Darin Harrington presents today for diabetic foot exam.  He has history of neuropathy with cc of painful, mycotic toenails.  Pain is aggravated when wearing enclosed shoe gear.  Most symptomatic chronic bilateral great toenails which he feels he has ingrown toenails.  He denies any edema, any erythema no drainage.  Darin Harrington is also requesting diabetic shoes on this visit.  He states he has pain on the plantar aspect of both feet on all weightbearing surfaces.  He also has left-sided weakness from a stroke.  His wife states he had a benign brain tumor which she had removed.  He suffered a stroke after his surgery.  He does have a residual left-sided weakness.  Past Medical History:  Diagnosis Date  . Allergic rhinitis   . Diabetes mellitus without complication ([place])    TYPE 2 WELL CONTROLED  . Headache(784.0)   . History of brain tumor   . Hyperlipidemia   . Pneumothorax, spontaneous, tension   . Seizures ([place]) 11/04/2013    " MY FIRST SEIZURE EVER "  . Stroke [facility])     Patient Active Problem List   Diagnosis Date Noted  . Cataracts, bilateral 10/12/2018  . Diabetes mellitus ([place]) 10/12/2018  . ED (erectile dysfunction) 10/12/2018  . HOH (hard of hearing) 10/12/2018  . OA (osteoarthritis) 10/12/2018  . Obesity 10/12/2018  . Seasonal allergies 10/12/2018  . Tinnitus, subjective 10/12/2018  . Transfusion history 10/12/2018  . Aftercare 06/23/2018  . Laceration of index finger 06/12/2018  . Pain in finger of right hand 06/12/2018  . Asthma 02/17/2018  . Chronic rhinitis 02/17/2018  . GERD (gastroesophageal reflux disease) 02/17/2018  . 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 Dysphonia 05/16/2017  . 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 Abnormality of gait 07/05/2014  . Depression 07/05/2014  . Difficulty sleeping 07/05/2014  . 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 Headache 12/07/2013  . Oropharyngeal dysphagia 12/07/2013  . 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 Visual field loss 11/25/2013  . Intracranial mass 11/04/2013  . Seizure ([place]) 11/04/2013  . Brain mass 11/04/2013  . Dilated aortic root ([place]) 10/08/2013  . Elevated BP 10/08/2013  . Dizziness 08/13/2013  . Near syncope 08/13/2013  . Pneumothorax, spontaneous, tension     Past Surgical History:  Procedure Laterality Date  . CARDIAC CATHETERIZATION     normal coronary arteries  . CATARACT EXTRACTION    . POPLITEAL SYNOVIAL CYST EXCISION    . TONSILLECTOMY       Current Outpatient Medications:  .  acetaminophen (TYLENOL) 325 MG tablet, Take 650 mg by mouth every 6 (six) hours as needed., Disp: , Rfl:  .  albuterol (VENTOLIN HFA) 108 (90 Base) MCG/ACT inhaler, Ventolin HFA 90 mcg/actuation aerosol inhaler, Disp: , Rfl:  .  azelastine (ASTELIN) 0.1 % nasal spray, azelastine 137 mcg (0.1 %) nasal spray aerosol, Disp: , Rfl:  .  chlorpheniramine (CHLOR-TRIMETON) 4 MG tablet, Take 2 tablets (8 mg total) by mouth 3 (three) times daily., Disp: 190 tablet, Rfl: 0 .  Cholecalciferol (VITAMIN D-1000 MAX ST) 1000 units tablet, Take by mouth., Disp: , Rfl:  .  ezetimibe (ZETIA) 10 MG tablet, Zetia 10 mg tablet, Disp: , Rfl:  .  fexofenadine (ALLEGRA) 180 MG tablet, Take by mouth., Disp: , Rfl:  .  guaifenesin (HUMIBID E) 400 MG TABS tablet, Chest Congestion Relief 400 mg tablet  TAKE 1 TABLET  BY MOUTH 3 TIMES A DAY FOR MUCOUS THINNING, Disp: , Rfl:  .  ipratropium-albuterol (DUONEB) 0.5-2.5 (3) MG/3ML SOLN, Take 3 mLs by nebulization every 6 (six) hours as needed. Dx: J45.909, Disp: 360 mL, Rfl: 11 .  losartan (COZAAR) 25 MG tablet, TAKE 1 TABLET BY MOUTH EVERY DAY, Disp: , Rfl:  .  metFORMIN (GLUCOPHAGE) 500 MG tablet, Take 500 mg by mouth See admin instructions. Take 500mg  with breakfast and lunch every day.  Take 500mg  at dinner time as needed if eats a Large dinner, Disp: ,  Rfl:  .  montelukast (SINGULAIR) 10 MG tablet, TAKE 1 TABLET BY MOUTH EVERYDAY AT BEDTIME, Disp: 30 tablet, Rfl: 0 .  omeprazole (PRILOSEC) 40 MG capsule, TAKE 1 CAPSULE BY MOUTH TWICE A DAY, Disp: 180 capsule, Rfl: 1 .  omeprazole-sodium bicarbonate (ZEGERID) 40-1100 MG capsule, OmePPi 40 mg-1.1 gram capsule, Disp: , Rfl:  .  Probiotic Product (PROBIOTIC-10 PO), Take by mouth., Disp: , Rfl:  .  SYMBICORT 160-4.5 MCG/ACT inhaler, , Disp: , Rfl:  .  escitalopram (LEXAPRO) 20 MG tablet, Take by mouth., Disp: , Rfl:  .  fluticasone (FLONASE) 50 MCG/ACT nasal spray, SPRAY 2 SPRAYS INTO EACH NOSTRIL EVERY DAY, Disp: , Rfl: 3 .  levETIRAcetam (KEPPRA) 500 MG tablet, TAKE 1 TABLET TWICE A DAY, Disp: , Rfl:  .  omeprazole (PRILOSEC) 40 MG capsule, TAKE 1 CAPSULE BY MOUTH TWICE A DAY (Patient not taking: Reported on 10/12/2018), Disp: 60 capsule, Rfl: 0  Allergies  Allergen Reactions  . Statins Other (See Comments)  . Amitriptyline Other (See Comments)    Severe cotton mouth.  . Atorvastatin Other (See Comments)    Intolerance to all Statins  . Other     Social History   Occupational History  . Not on file  Tobacco Use  . Smoking status: Former Smoker    Packs/day: 1.50    Types: Cigarettes    Last attempt to quit: 1980    Years since quitting: 40.0  . Smokeless tobacco: Never Used  Substance and Sexual Activity  . Alcohol use: No    Comment: occasional wine  . Drug use: No  . Sexual activity: Not on file    Family History  Problem Relation Age of Onset  . Alzheimer's disease Father     Immunization History  Administered Date(s) Administered  . Influenza, High Dose Seasonal PF 06/23/2017    Review of systems: Positive Findings in bold print.  Constitutional:  chills, fatigue, fever, sweats, weight change Communication: Optometrist, sign Ecologist, hand writing, iPad/Android device Head: headaches, head injury Eyes: changes in vision, eye pain, glaucoma, cataracts,  macular degeneration, diplopia, glare,  light sensitivity, eyeglasses or contacts, blindness Ears nose mouth throat: Hard of hearing, ringing in ears, deaf, sign language,  vertigo,   nosebleeds,  rhinitis,  cold sores, snoring, swollen glands Cardiovascular: HTN, edema, arrhythmia, pacemaker in place, defibrillator in place,  chest pain/tightness, chronic anticoagulation, blood clot, heart failure Peripheral Vascular: leg cramps, varicose veins, blood clots, lymphedema Respiratory:  difficulty breathing, denies congestion, SOB, wheezing, cough, emphysema, asthma Gastrointestinal: change in appetite or weight, abdominal pain, constipation, diarrhea, nausea, vomiting, vomiting blood, change in bowel habits, abdominal pain, jaundice, rectal bleeding, hemorrhoids, Genitourinary:  nocturia,  pain on urination,  blood in urine, Foley catheter, urinary urgency Musculoskeletal: uses mobility aid,  cramping, stiff joints, painful joints, decreased joint motion, fractures, OA, gout, gait  abnormality Skin: +changes in toenails, color change, dryness, itching, mole changes,  rash  Neurological: headaches, numbness in feet, paresthesias in feet, burning in feet, fainting,  seizures, change in speech. denies headaches, memory problems/poor historian, cerebral palsy, weakness, paralysis Endocrine: diabetes, hypothyroidism, hyperthyroidism,  goiter, dry mouth, flushing, heat intolerance,  cold intolerance,  excessive thirst, denies polyuria,  nocturia Hematological:  easy bleeding, excessive bleeding, easy bruising, enlarged lymph nodes, on long term blood thinner, history of past transusions Allergy/immunological:  hives, eczema, frequent infections, multiple drug allergies, seasonal allergies, transplant recipient Psychiatric:  anxiety, depression, mood disorder, suicidal ideations, hallucinations   Objective:  Vascular Examination: Capillary refill time immediate x 10 digits Dorsalis pedis and Posterior  tibial pulses present b/l Digital hair x 10 digits was sparse Skin temperature gradient WNL b/l  Dermatological Examination: Skin with normal turgor texture and tone bilaterally  Toenails 1-5 b/l discolored, thick, dystrophic with subungual debris and pain with palpation to nailbeds due to thickness of nails.  Hyperkeratotic lesions noted submetatarsal head 5 bilaterally  Musculoskeletal: Muscle strength 5/5 to all muscle groups bilateral lower extremities.  He has history of left-sided weakness but surprisingly his left foot muscle strength is within normal limits.  Hallux abductovalgus with bunion deformity bilaterally  Hammertoe deformity bilateral 2 through 5  Neurological: Sensation with 10 gram monofilament diminished Vibratory sensation diminished  Assessment: 1. Painful onychomycosis toenails 1-5 b/l 2. Callus submetatarsal head 5 bilaterally 3. NIDDM with peripheral neuropathy 4. Hallux abductovalgus with bunion deformity bilaterally 5. Hammertoe deformity 2 through 5 bilaterally  Plan: 1. Toenails 1-5 b/l were debrided in length and girth without iatrogenic bleeding.  Offending nail borders debrided and curettaged bilateral great toes. 2. Hyperkeratotic lesions debrided without incident submetatarsal heads 5 bilaterally 3. Based on today's examination, the following diagnoses support him having diabetic shoes.  Those diagnoses are: NIDDM with neuropathy, hallux abductovalgus with bunion deformity bilaterally, hammertoe deformity 2 through 5 bilaterally, calluses submetatarsal head 5 bilaterally.  Today he was molded and measured by our Pedorthist, Velora Heckler,  for his diabetic shoes.  He will have offloading for his plantar calluses. 4. Patient to continue soft, supportive shoe gear 5. Patient to report any pedal injuries to medical professional  6. Follow up 3 months for diabetic foot care.  7. Patient/POA to call should there be a concern in the interim.

## 2018-11-03 DIAGNOSIS — S61211A Laceration without foreign body of left index finger without damage to nail, initial encounter: Secondary | ICD-10-CM | POA: Diagnosis not present

## 2018-11-09 ENCOUNTER — Telehealth: Payer: Self-pay | Admitting: Podiatry

## 2018-11-09 NOTE — Telephone Encounter (Signed)
Pt left message checking on status of diabetic shoes.  I returned call and the shoes we ordered him are on back order until end of March. Pt gave me the ok to proceed with ordering a similar pair for him to try. He stated he just wants something to make his feet to stop hurting. I ordered the similar pair and will call pt when they arrive.

## 2018-11-13 DIAGNOSIS — Z9889 Other specified postprocedural states: Secondary | ICD-10-CM | POA: Diagnosis not present

## 2018-11-13 DIAGNOSIS — Z86011 Personal history of benign neoplasm of the brain: Secondary | ICD-10-CM | POA: Diagnosis not present

## 2018-11-13 DIAGNOSIS — G40109 Localization-related (focal) (partial) symptomatic epilepsy and epileptic syndromes with simple partial seizures, not intractable, without status epilepticus: Secondary | ICD-10-CM | POA: Diagnosis not present

## 2018-11-13 DIAGNOSIS — Z8669 Personal history of other diseases of the nervous system and sense organs: Secondary | ICD-10-CM | POA: Diagnosis not present

## 2018-12-02 ENCOUNTER — Ambulatory Visit: Payer: Medicare Other | Admitting: Orthotics

## 2018-12-02 DIAGNOSIS — E1140 Type 2 diabetes mellitus with diabetic neuropathy, unspecified: Secondary | ICD-10-CM

## 2018-12-02 DIAGNOSIS — M2041 Other hammer toe(s) (acquired), right foot: Secondary | ICD-10-CM

## 2018-12-02 DIAGNOSIS — M2012 Hallux valgus (acquired), left foot: Secondary | ICD-10-CM | POA: Diagnosis not present

## 2018-12-02 DIAGNOSIS — M2011 Hallux valgus (acquired), right foot: Secondary | ICD-10-CM | POA: Diagnosis not present

## 2018-12-02 DIAGNOSIS — M2042 Other hammer toe(s) (acquired), left foot: Secondary | ICD-10-CM

## 2018-12-03 IMAGING — DX DG CHEST 2V
2 series · 2 of 2 positions shown · non-contrast
Comparison: No recent prior.

CLINICAL DATA: Cough and congestion. Shortness of breath. Chest
pain.

EXAM:
CHEST - 2 VIEW

[chest pa]
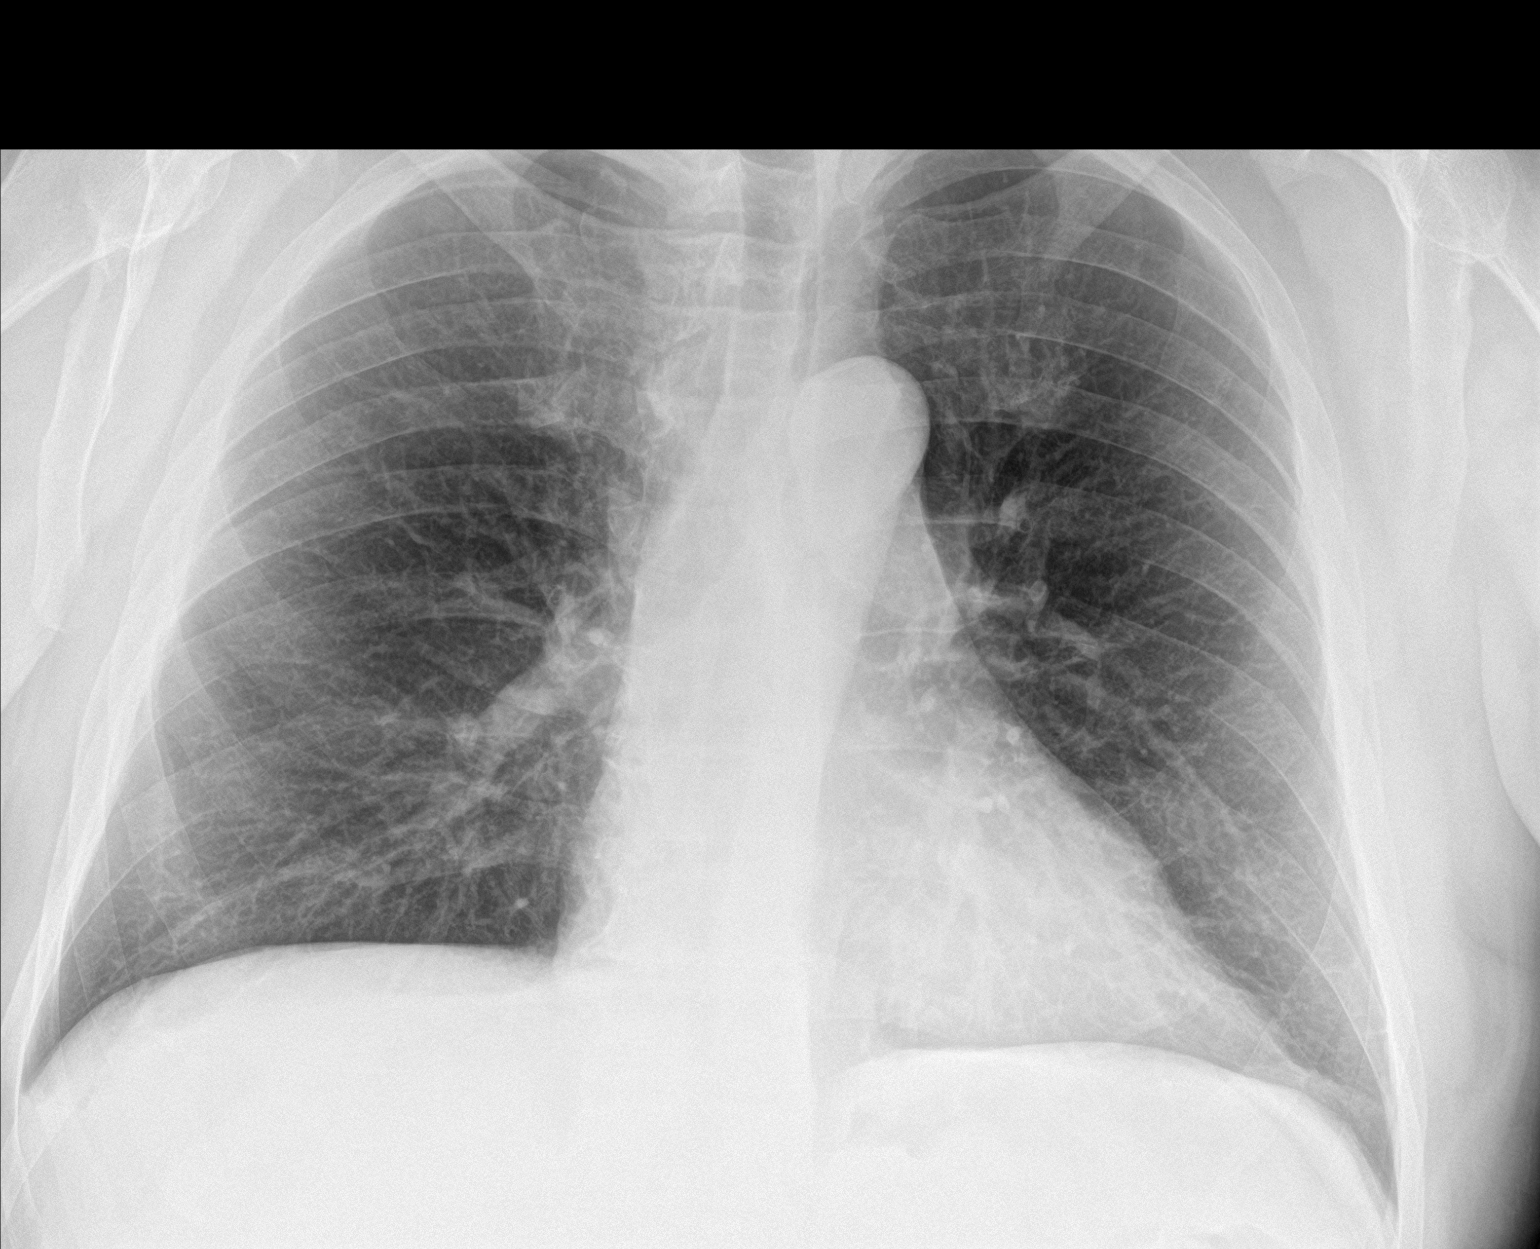

[chest lat]
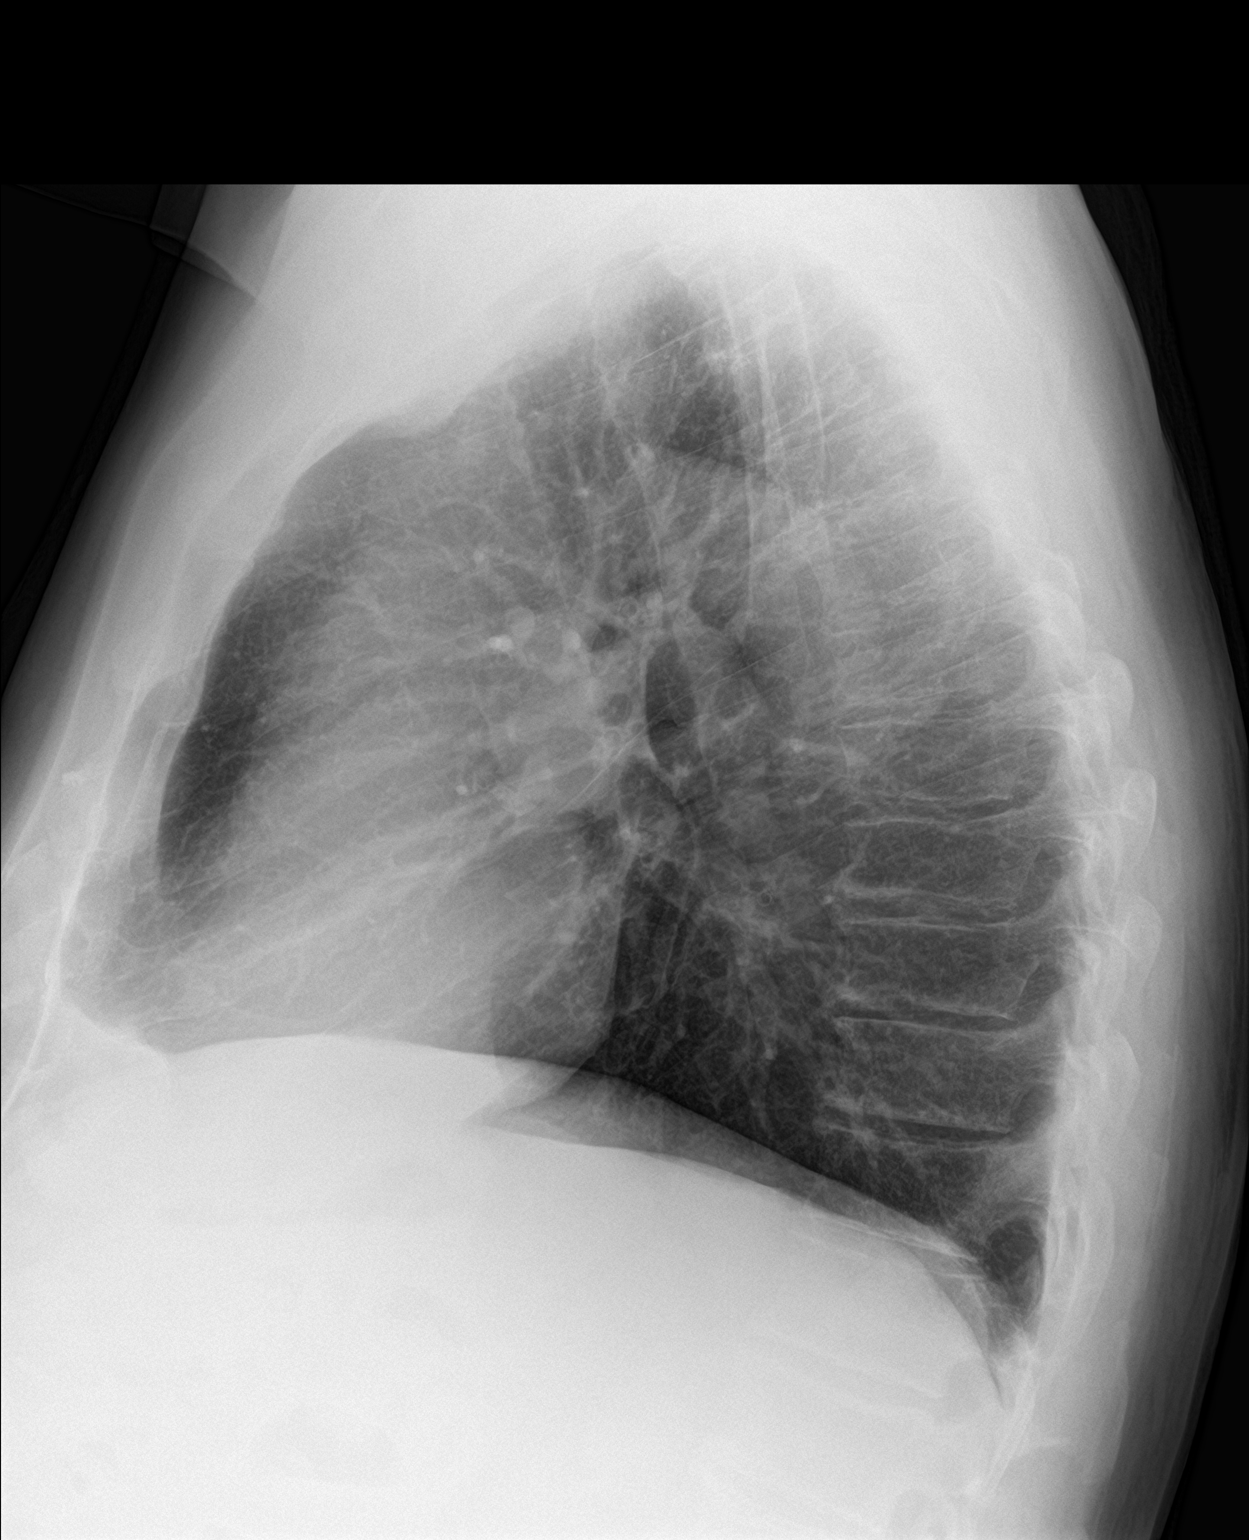

[2 of 2 positions shown; findings below may reference images not displayed]

FINDINGS: Mediastinum and hilar structures normal. Heart size normal. Mild
left base subsegmental atelectasis. No pleural effusion or
pneumothorax.
IMPRESSION: 1.  Mild left base subsegmental atelectasis.

2.  Cardiomegaly.  No pulmonary venous congestion.

## 2018-12-04 DIAGNOSIS — D329 Benign neoplasm of meninges, unspecified: Secondary | ICD-10-CM | POA: Diagnosis not present

## 2018-12-04 DIAGNOSIS — S0100XA Unspecified open wound of scalp, initial encounter: Secondary | ICD-10-CM | POA: Diagnosis not present

## 2018-12-10 DIAGNOSIS — S61219A Laceration without foreign body of unspecified finger without damage to nail, initial encounter: Secondary | ICD-10-CM | POA: Diagnosis not present

## 2018-12-10 DIAGNOSIS — R0781 Pleurodynia: Secondary | ICD-10-CM | POA: Diagnosis not present

## 2019-01-12 ENCOUNTER — Ambulatory Visit: Payer: Medicare Other | Admitting: Podiatry

## 2019-03-01 DIAGNOSIS — Z125 Encounter for screening for malignant neoplasm of prostate: Secondary | ICD-10-CM | POA: Diagnosis not present

## 2019-03-01 DIAGNOSIS — E1129 Type 2 diabetes mellitus with other diabetic kidney complication: Secondary | ICD-10-CM | POA: Diagnosis not present

## 2019-03-01 DIAGNOSIS — E538 Deficiency of other specified B group vitamins: Secondary | ICD-10-CM | POA: Diagnosis not present

## 2019-03-01 DIAGNOSIS — R809 Proteinuria, unspecified: Secondary | ICD-10-CM | POA: Diagnosis not present

## 2019-03-01 DIAGNOSIS — E7849 Other hyperlipidemia: Secondary | ICD-10-CM | POA: Diagnosis not present

## 2019-03-01 DIAGNOSIS — R82998 Other abnormal findings in urine: Secondary | ICD-10-CM | POA: Diagnosis not present

## 2019-03-05 DIAGNOSIS — E538 Deficiency of other specified B group vitamins: Secondary | ICD-10-CM | POA: Diagnosis not present

## 2019-03-05 DIAGNOSIS — Z0000 Encounter for general adult medical examination without abnormal findings: Secondary | ICD-10-CM | POA: Diagnosis not present

## 2019-03-05 DIAGNOSIS — L84 Corns and callosities: Secondary | ICD-10-CM | POA: Diagnosis not present

## 2019-03-05 DIAGNOSIS — F321 Major depressive disorder, single episode, moderate: Secondary | ICD-10-CM | POA: Diagnosis not present

## 2019-03-05 DIAGNOSIS — N182 Chronic kidney disease, stage 2 (mild): Secondary | ICD-10-CM | POA: Diagnosis not present

## 2019-03-05 DIAGNOSIS — Z79899 Other long term (current) drug therapy: Secondary | ICD-10-CM | POA: Diagnosis not present

## 2019-03-05 DIAGNOSIS — I129 Hypertensive chronic kidney disease with stage 1 through stage 4 chronic kidney disease, or unspecified chronic kidney disease: Secondary | ICD-10-CM | POA: Diagnosis not present

## 2019-03-05 DIAGNOSIS — R809 Proteinuria, unspecified: Secondary | ICD-10-CM | POA: Diagnosis not present

## 2019-03-05 DIAGNOSIS — J4530 Mild persistent asthma, uncomplicated: Secondary | ICD-10-CM | POA: Diagnosis not present

## 2019-03-05 DIAGNOSIS — D692 Other nonthrombocytopenic purpura: Secondary | ICD-10-CM | POA: Diagnosis not present

## 2019-03-05 DIAGNOSIS — E1129 Type 2 diabetes mellitus with other diabetic kidney complication: Secondary | ICD-10-CM | POA: Diagnosis not present

## 2019-03-05 DIAGNOSIS — F418 Other specified anxiety disorders: Secondary | ICD-10-CM | POA: Diagnosis not present

## 2019-05-18 DIAGNOSIS — Z03818 Encounter for observation for suspected exposure to other biological agents ruled out: Secondary | ICD-10-CM | POA: Diagnosis not present

## 2019-07-07 DIAGNOSIS — Z03818 Encounter for observation for suspected exposure to other biological agents ruled out: Secondary | ICD-10-CM | POA: Diagnosis not present

## 2019-07-22 DIAGNOSIS — Z23 Encounter for immunization: Secondary | ICD-10-CM | POA: Diagnosis not present

## 2019-09-07 DIAGNOSIS — F321 Major depressive disorder, single episode, moderate: Secondary | ICD-10-CM | POA: Diagnosis not present

## 2019-09-07 DIAGNOSIS — L84 Corns and callosities: Secondary | ICD-10-CM | POA: Diagnosis not present

## 2019-09-07 DIAGNOSIS — D692 Other nonthrombocytopenic purpura: Secondary | ICD-10-CM | POA: Diagnosis not present

## 2019-09-07 DIAGNOSIS — F418 Other specified anxiety disorders: Secondary | ICD-10-CM | POA: Diagnosis not present

## 2019-09-07 DIAGNOSIS — M1990 Unspecified osteoarthritis, unspecified site: Secondary | ICD-10-CM | POA: Diagnosis not present

## 2019-09-07 DIAGNOSIS — E1129 Type 2 diabetes mellitus with other diabetic kidney complication: Secondary | ICD-10-CM | POA: Diagnosis not present

## 2019-09-07 DIAGNOSIS — R809 Proteinuria, unspecified: Secondary | ICD-10-CM | POA: Diagnosis not present

## 2019-09-07 DIAGNOSIS — I129 Hypertensive chronic kidney disease with stage 1 through stage 4 chronic kidney disease, or unspecified chronic kidney disease: Secondary | ICD-10-CM | POA: Diagnosis not present

## 2019-09-07 DIAGNOSIS — E538 Deficiency of other specified B group vitamins: Secondary | ICD-10-CM | POA: Diagnosis not present

## 2019-09-07 DIAGNOSIS — J4530 Mild persistent asthma, uncomplicated: Secondary | ICD-10-CM | POA: Diagnosis not present

## 2019-09-07 DIAGNOSIS — G4089 Other seizures: Secondary | ICD-10-CM | POA: Diagnosis not present

## 2019-09-07 DIAGNOSIS — N182 Chronic kidney disease, stage 2 (mild): Secondary | ICD-10-CM | POA: Diagnosis not present

## 2019-11-07 ENCOUNTER — Ambulatory Visit: Payer: Medicare Other

## 2019-11-24 ENCOUNTER — Ambulatory Visit: Payer: Medicare Other

## 2020-01-10 DIAGNOSIS — R05 Cough: Secondary | ICD-10-CM | POA: Diagnosis not present

## 2020-01-10 DIAGNOSIS — E1129 Type 2 diabetes mellitus with other diabetic kidney complication: Secondary | ICD-10-CM | POA: Diagnosis not present

## 2020-01-10 DIAGNOSIS — J45909 Unspecified asthma, uncomplicated: Secondary | ICD-10-CM | POA: Diagnosis not present

## 2020-01-10 DIAGNOSIS — N182 Chronic kidney disease, stage 2 (mild): Secondary | ICD-10-CM | POA: Diagnosis not present

## 2020-03-01 DIAGNOSIS — Z125 Encounter for screening for malignant neoplasm of prostate: Secondary | ICD-10-CM | POA: Diagnosis not present

## 2020-03-01 DIAGNOSIS — E538 Deficiency of other specified B group vitamins: Secondary | ICD-10-CM | POA: Diagnosis not present

## 2020-03-01 DIAGNOSIS — E1129 Type 2 diabetes mellitus with other diabetic kidney complication: Secondary | ICD-10-CM | POA: Diagnosis not present

## 2020-03-01 DIAGNOSIS — E7849 Other hyperlipidemia: Secondary | ICD-10-CM | POA: Diagnosis not present

## 2020-03-06 DIAGNOSIS — Z0000 Encounter for general adult medical examination without abnormal findings: Secondary | ICD-10-CM | POA: Diagnosis not present

## 2020-03-06 DIAGNOSIS — M1990 Unspecified osteoarthritis, unspecified site: Secondary | ICD-10-CM | POA: Diagnosis not present

## 2020-03-06 DIAGNOSIS — N529 Male erectile dysfunction, unspecified: Secondary | ICD-10-CM | POA: Diagnosis not present

## 2020-03-06 DIAGNOSIS — F321 Major depressive disorder, single episode, moderate: Secondary | ICD-10-CM | POA: Diagnosis not present

## 2020-03-06 DIAGNOSIS — F418 Other specified anxiety disorders: Secondary | ICD-10-CM | POA: Diagnosis not present

## 2020-03-06 DIAGNOSIS — E538 Deficiency of other specified B group vitamins: Secondary | ICD-10-CM | POA: Diagnosis not present

## 2020-03-06 DIAGNOSIS — H9319 Tinnitus, unspecified ear: Secondary | ICD-10-CM | POA: Diagnosis not present

## 2020-03-06 DIAGNOSIS — R82998 Other abnormal findings in urine: Secondary | ICD-10-CM | POA: Diagnosis not present

## 2020-03-06 DIAGNOSIS — N401 Enlarged prostate with lower urinary tract symptoms: Secondary | ICD-10-CM | POA: Diagnosis not present

## 2020-03-06 DIAGNOSIS — E785 Hyperlipidemia, unspecified: Secondary | ICD-10-CM | POA: Diagnosis not present

## 2020-03-06 DIAGNOSIS — D329 Benign neoplasm of meninges, unspecified: Secondary | ICD-10-CM | POA: Diagnosis not present

## 2020-03-06 DIAGNOSIS — E1129 Type 2 diabetes mellitus with other diabetic kidney complication: Secondary | ICD-10-CM | POA: Diagnosis not present

## 2020-03-06 DIAGNOSIS — G4089 Other seizures: Secondary | ICD-10-CM | POA: Diagnosis not present

## 2020-03-27 DIAGNOSIS — H5340 Unspecified visual field defects: Secondary | ICD-10-CM | POA: Diagnosis not present

## 2020-03-27 DIAGNOSIS — Z8669 Personal history of other diseases of the nervous system and sense organs: Secondary | ICD-10-CM | POA: Diagnosis not present

## 2020-03-27 DIAGNOSIS — Z8603 Personal history of neoplasm of uncertain behavior: Secondary | ICD-10-CM | POA: Diagnosis not present

## 2020-03-27 DIAGNOSIS — Z9889 Other specified postprocedural states: Secondary | ICD-10-CM | POA: Diagnosis not present

## 2020-08-04 DIAGNOSIS — Z23 Encounter for immunization: Secondary | ICD-10-CM | POA: Diagnosis not present

## 2020-09-19 DIAGNOSIS — E1129 Type 2 diabetes mellitus with other diabetic kidney complication: Secondary | ICD-10-CM | POA: Diagnosis not present

## 2020-09-19 DIAGNOSIS — F321 Major depressive disorder, single episode, moderate: Secondary | ICD-10-CM | POA: Diagnosis not present

## 2020-09-19 DIAGNOSIS — Z79899 Other long term (current) drug therapy: Secondary | ICD-10-CM | POA: Diagnosis not present

## 2020-09-19 DIAGNOSIS — R809 Proteinuria, unspecified: Secondary | ICD-10-CM | POA: Diagnosis not present

## 2020-09-19 DIAGNOSIS — J4530 Mild persistent asthma, uncomplicated: Secondary | ICD-10-CM | POA: Diagnosis not present

## 2020-09-19 DIAGNOSIS — H9319 Tinnitus, unspecified ear: Secondary | ICD-10-CM | POA: Diagnosis not present

## 2020-09-19 DIAGNOSIS — F418 Other specified anxiety disorders: Secondary | ICD-10-CM | POA: Diagnosis not present

## 2020-09-19 DIAGNOSIS — E538 Deficiency of other specified B group vitamins: Secondary | ICD-10-CM | POA: Diagnosis not present

## 2020-09-19 DIAGNOSIS — N182 Chronic kidney disease, stage 2 (mild): Secondary | ICD-10-CM | POA: Diagnosis not present

## 2020-09-19 DIAGNOSIS — I129 Hypertensive chronic kidney disease with stage 1 through stage 4 chronic kidney disease, or unspecified chronic kidney disease: Secondary | ICD-10-CM | POA: Diagnosis not present

## 2020-09-19 DIAGNOSIS — G4089 Other seizures: Secondary | ICD-10-CM | POA: Diagnosis not present

## 2020-09-19 DIAGNOSIS — E6609 Other obesity due to excess calories: Secondary | ICD-10-CM | POA: Diagnosis not present

## 2020-12-04 DIAGNOSIS — E1129 Type 2 diabetes mellitus with other diabetic kidney complication: Secondary | ICD-10-CM | POA: Diagnosis not present

## 2020-12-04 DIAGNOSIS — J4541 Moderate persistent asthma with (acute) exacerbation: Secondary | ICD-10-CM | POA: Diagnosis not present

## 2020-12-04 DIAGNOSIS — R059 Cough, unspecified: Secondary | ICD-10-CM | POA: Diagnosis not present

## 2020-12-13 DIAGNOSIS — N401 Enlarged prostate with lower urinary tract symptoms: Secondary | ICD-10-CM | POA: Diagnosis not present

## 2020-12-13 DIAGNOSIS — E1129 Type 2 diabetes mellitus with other diabetic kidney complication: Secondary | ICD-10-CM | POA: Diagnosis not present

## 2020-12-13 DIAGNOSIS — R351 Nocturia: Secondary | ICD-10-CM | POA: Diagnosis not present

## 2020-12-13 DIAGNOSIS — J4530 Mild persistent asthma, uncomplicated: Secondary | ICD-10-CM | POA: Diagnosis not present

## 2021-01-01 DIAGNOSIS — N400 Enlarged prostate without lower urinary tract symptoms: Secondary | ICD-10-CM | POA: Diagnosis not present

## 2021-01-01 DIAGNOSIS — N319 Neuromuscular dysfunction of bladder, unspecified: Secondary | ICD-10-CM | POA: Diagnosis not present

## 2021-01-01 DIAGNOSIS — N320 Bladder-neck obstruction: Secondary | ICD-10-CM | POA: Diagnosis not present

## 2021-01-02 DIAGNOSIS — N3941 Urge incontinence: Secondary | ICD-10-CM | POA: Diagnosis not present

## 2021-01-09 DIAGNOSIS — R399 Unspecified symptoms and signs involving the genitourinary system: Secondary | ICD-10-CM | POA: Diagnosis not present

## 2021-04-03 DIAGNOSIS — Z125 Encounter for screening for malignant neoplasm of prostate: Secondary | ICD-10-CM | POA: Diagnosis not present

## 2021-04-03 DIAGNOSIS — E1129 Type 2 diabetes mellitus with other diabetic kidney complication: Secondary | ICD-10-CM | POA: Diagnosis not present

## 2021-04-03 DIAGNOSIS — E7800 Pure hypercholesterolemia, unspecified: Secondary | ICD-10-CM | POA: Diagnosis not present

## 2021-04-03 DIAGNOSIS — E538 Deficiency of other specified B group vitamins: Secondary | ICD-10-CM | POA: Diagnosis not present

## 2021-04-04 DIAGNOSIS — N3941 Urge incontinence: Secondary | ICD-10-CM | POA: Diagnosis not present

## 2021-04-09 DIAGNOSIS — Z0000 Encounter for general adult medical examination without abnormal findings: Secondary | ICD-10-CM | POA: Diagnosis not present

## 2021-04-09 DIAGNOSIS — M1990 Unspecified osteoarthritis, unspecified site: Secondary | ICD-10-CM | POA: Diagnosis not present

## 2021-04-09 DIAGNOSIS — F418 Other specified anxiety disorders: Secondary | ICD-10-CM | POA: Diagnosis not present

## 2021-04-09 DIAGNOSIS — F321 Major depressive disorder, single episode, moderate: Secondary | ICD-10-CM | POA: Diagnosis not present

## 2021-04-09 DIAGNOSIS — E663 Overweight: Secondary | ICD-10-CM | POA: Diagnosis not present

## 2021-04-09 DIAGNOSIS — N182 Chronic kidney disease, stage 2 (mild): Secondary | ICD-10-CM | POA: Diagnosis not present

## 2021-04-09 DIAGNOSIS — Z1212 Encounter for screening for malignant neoplasm of rectum: Secondary | ICD-10-CM | POA: Diagnosis not present

## 2021-04-09 DIAGNOSIS — Z79899 Other long term (current) drug therapy: Secondary | ICD-10-CM | POA: Diagnosis not present

## 2021-04-09 DIAGNOSIS — R82998 Other abnormal findings in urine: Secondary | ICD-10-CM | POA: Diagnosis not present

## 2021-04-09 DIAGNOSIS — E1129 Type 2 diabetes mellitus with other diabetic kidney complication: Secondary | ICD-10-CM | POA: Diagnosis not present

## 2021-04-09 DIAGNOSIS — G4089 Other seizures: Secondary | ICD-10-CM | POA: Diagnosis not present

## 2021-04-09 DIAGNOSIS — M7502 Adhesive capsulitis of left shoulder: Secondary | ICD-10-CM | POA: Diagnosis not present

## 2021-04-09 DIAGNOSIS — I129 Hypertensive chronic kidney disease with stage 1 through stage 4 chronic kidney disease, or unspecified chronic kidney disease: Secondary | ICD-10-CM | POA: Diagnosis not present

## 2021-04-09 DIAGNOSIS — D692 Other nonthrombocytopenic purpura: Secondary | ICD-10-CM | POA: Diagnosis not present

## 2021-07-10 DIAGNOSIS — N3941 Urge incontinence: Secondary | ICD-10-CM | POA: Diagnosis not present

## 2021-07-28 DIAGNOSIS — Z23 Encounter for immunization: Secondary | ICD-10-CM | POA: Diagnosis not present

## 2021-08-04 DIAGNOSIS — Z20828 Contact with and (suspected) exposure to other viral communicable diseases: Secondary | ICD-10-CM | POA: Diagnosis not present

## 2021-10-30 DIAGNOSIS — N3941 Urge incontinence: Secondary | ICD-10-CM | POA: Diagnosis not present

## 2021-10-30 DIAGNOSIS — R800 Isolated proteinuria: Secondary | ICD-10-CM | POA: Diagnosis not present

## 2021-10-30 DIAGNOSIS — N138 Other obstructive and reflux uropathy: Secondary | ICD-10-CM | POA: Diagnosis not present

## 2021-10-30 DIAGNOSIS — N401 Enlarged prostate with lower urinary tract symptoms: Secondary | ICD-10-CM | POA: Diagnosis not present

## 2021-11-15 DIAGNOSIS — Z20822 Contact with and (suspected) exposure to covid-19: Secondary | ICD-10-CM | POA: Diagnosis not present

## 2021-12-27 DIAGNOSIS — Z20822 Contact with and (suspected) exposure to covid-19: Secondary | ICD-10-CM | POA: Diagnosis not present

## 2022-01-16 DIAGNOSIS — Z20822 Contact with and (suspected) exposure to covid-19: Secondary | ICD-10-CM | POA: Diagnosis not present

## 2022-01-22 DIAGNOSIS — Z20822 Contact with and (suspected) exposure to covid-19: Secondary | ICD-10-CM | POA: Diagnosis not present

## 2022-01-28 DIAGNOSIS — Z20828 Contact with and (suspected) exposure to other viral communicable diseases: Secondary | ICD-10-CM | POA: Diagnosis not present

## 2022-01-29 DIAGNOSIS — Z20822 Contact with and (suspected) exposure to covid-19: Secondary | ICD-10-CM | POA: Diagnosis not present

## 2022-02-01 DIAGNOSIS — Z20822 Contact with and (suspected) exposure to covid-19: Secondary | ICD-10-CM | POA: Diagnosis not present

## 2022-02-19 NOTE — Progress Notes (Signed)
 Subjective:    Patient ID:   HPI  Mr. Darin Harrington is a 71year old male whoinitially presented with progressive visual loss in his right eye and a grand mal seizure. 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 He iss/p right anterior skull base craniotomy for a meningioma resection on 3/6/15with Dr. Norleen LABOR. [place]. At his last visit on 03/27/20 he reported soreness at the incision which had been presents since surgery; no drainage, redness or fever.   Today he presents for 2 year follow up with MRI.  He has been doing well.  He reports no change in incisional pain but denies any drainage or redness.  He feels his vision may be slightly worse; bilaterally.  He reports generalized weakness when he overdoes it.  Denies headaches, hemibody weakness/numbness, confusion.  He is accompanied by his wife.  The following portions of the patient's history were reviewed and updated as appropriate: allergies, current medications, past family history, past medical history, past social history, past surgical history and problem list.  Review of Systems Review of Systems  Eyes: Positive for blurred vision.  Neurological: Negative for tingling, weakness and headaches.  All other systems reviewed and are negative.   Objective:  Physical Exam: Physical Exam Vitals reviewed.  Constitutional:      Appearance: Normal appearance.  HENT:     Head: Normocephalic.     Nose: Nose normal.  Eyes:     Extraocular Movements: EOM normal.  Pulmonary:     Effort: Pulmonary effort is normal.  Skin:    General: Skin is warm and dry.  Neurological:     Mental Status: He is alert and oriented to person, place, and time.     Coordination: Finger-Nose-Finger Test normal.     Gait: Gait is intact.  Psychiatric:        Speech: Speech normal.    Neurologic Exam   Mental Status  Oriented to person,  place, and time.  Attention: normal. Concentration: normal.  Speech: speech is normal  Level of consciousness: alert Knowledge: good.   Cranial Nerves   CN II  Visual fields full to confrontation.   CN III, IV, VI  Extraocular motions are normal.  Pupils: dilated Right pupil: Size: 5 mm. Shape: regular. Reactivity: sluggish.  Left pupil: Size: 5 mm. Shape: regular. Reactivity: sluggish.   CN V  Facial sensation intact.   CN VII  Facial expression full, symmetric.   CN XII  CN XII normal.   Motor Exam  Muscle bulk: normal Overall muscle tone: normal  Strength  Right biceps: 5/5 Left biceps: 5/5 Right triceps: 5/5 Left triceps: 5/5 Right iliopsoas: 5/5 Left iliopsoas: 5/5  Sensory Exam  Light touch normal.   Gait, Coordination, and Reflexes   Gait Gait: normal  Coordination  Finger to nose coordination: normal   This is a shared visit with Dr. Norleen LABOR. [place] and [employer] FNP-C. Electronically signed by:  Powell FORBES Gentry, FNP, 03/11/2022 12:57 PM I saw patient with Powell Gentry, FNP-C, and agree with findings documented above. I personally confirmed history and performed exam. It is as documented above and amended by me. I personally reviewed the radiographs with the patient and directed medical decision making.  Saw the patient in the office today for follow-up evaluation.  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  He had presented with  visual impairment in his right eye and this large medial sphenoid wing tumor.  His visual impairment has been stable with primarily inferior visual field defects in the right eye.  He had a tough postoperative course but ultimately did fairly well and has been stable over the course of his number of years now.  He does have residual effects after the surgery.  He continues to use a cane.  Has some short-term memory impairment.  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  I independently interpreted his MRI scan of the brain.  This study shows the expected postsurgical change after resection of this very large tumor.  There is some residual enhancement along the lateral wall the cavernous sinus and some enhancement within the cavernous sinus.  This has been present throughout the day and entire time of follow-up and has not really changed or progressed over time.  All in all everything looks stable and there is no clear-cut evidence of residual or recurrent tumor.  Overall I think he is doing quite well.  I explained the patient that oftentimes after 8 years we would no longer do additional follow-up imaging studies.  Given the fact that his tumor was grade 2 would like to do 1 additional MRI scans in 4 years in 2027 Assessment:  s/p right anterior skull base craniotomy for a meningioma resection on 12/03/13  Plan:  Follow-up 4 years with final MRI    Electronically signed by: Norleen Dasie Blush, MD 03/12/22 1024

## 2022-03-11 DIAGNOSIS — Z9889 Other specified postprocedural states: Secondary | ICD-10-CM | POA: Diagnosis not present

## 2022-03-11 DIAGNOSIS — D329 Benign neoplasm of meninges, unspecified: Secondary | ICD-10-CM | POA: Diagnosis not present

## 2022-03-11 DIAGNOSIS — Z09 Encounter for follow-up examination after completed treatment for conditions other than malignant neoplasm: Secondary | ICD-10-CM | POA: Diagnosis not present

## 2022-03-11 DIAGNOSIS — H5340 Unspecified visual field defects: Secondary | ICD-10-CM | POA: Diagnosis not present

## 2022-03-11 DIAGNOSIS — R413 Other amnesia: Secondary | ICD-10-CM | POA: Diagnosis not present

## 2022-03-11 DIAGNOSIS — H47291 Other optic atrophy, right eye: Secondary | ICD-10-CM | POA: Diagnosis not present

## 2022-03-11 DIAGNOSIS — H02401 Unspecified ptosis of right eyelid: Secondary | ICD-10-CM | POA: Diagnosis not present

## 2022-03-11 DIAGNOSIS — Z8603 Personal history of neoplasm of uncertain behavior: Secondary | ICD-10-CM | POA: Diagnosis not present

## 2022-03-11 DIAGNOSIS — Z86011 Personal history of benign neoplasm of the brain: Secondary | ICD-10-CM | POA: Diagnosis not present

## 2022-03-11 DIAGNOSIS — H468 Other optic neuritis: Secondary | ICD-10-CM | POA: Diagnosis not present

## 2022-03-11 DIAGNOSIS — H53431 Sector or arcuate defects, right eye: Secondary | ICD-10-CM | POA: Diagnosis not present

## 2022-03-11 DIAGNOSIS — D420 Neoplasm of uncertain behavior of cerebral meninges: Secondary | ICD-10-CM | POA: Diagnosis not present

## 2022-03-11 DIAGNOSIS — H4901 Third [oculomotor] nerve palsy, right eye: Secondary | ICD-10-CM | POA: Diagnosis not present

## 2022-04-18 DIAGNOSIS — R739 Hyperglycemia, unspecified: Secondary | ICD-10-CM | POA: Diagnosis not present

## 2022-04-18 DIAGNOSIS — E7800 Pure hypercholesterolemia, unspecified: Secondary | ICD-10-CM | POA: Diagnosis not present

## 2022-04-18 DIAGNOSIS — R7989 Other specified abnormal findings of blood chemistry: Secondary | ICD-10-CM | POA: Diagnosis not present

## 2022-04-18 DIAGNOSIS — E538 Deficiency of other specified B group vitamins: Secondary | ICD-10-CM | POA: Diagnosis not present

## 2022-04-18 DIAGNOSIS — Z0000 Encounter for general adult medical examination without abnormal findings: Secondary | ICD-10-CM | POA: Diagnosis not present

## 2022-04-18 DIAGNOSIS — Z125 Encounter for screening for malignant neoplasm of prostate: Secondary | ICD-10-CM | POA: Diagnosis not present

## 2022-04-25 DIAGNOSIS — G4089 Other seizures: Secondary | ICD-10-CM | POA: Diagnosis not present

## 2022-04-25 DIAGNOSIS — N401 Enlarged prostate with lower urinary tract symptoms: Secondary | ICD-10-CM | POA: Diagnosis not present

## 2022-04-25 DIAGNOSIS — R82998 Other abnormal findings in urine: Secondary | ICD-10-CM | POA: Diagnosis not present

## 2022-04-25 DIAGNOSIS — Z0000 Encounter for general adult medical examination without abnormal findings: Secondary | ICD-10-CM | POA: Diagnosis not present

## 2022-04-25 DIAGNOSIS — J4530 Mild persistent asthma, uncomplicated: Secondary | ICD-10-CM | POA: Diagnosis not present

## 2022-04-25 DIAGNOSIS — N182 Chronic kidney disease, stage 2 (mild): Secondary | ICD-10-CM | POA: Diagnosis not present

## 2022-04-25 DIAGNOSIS — E1129 Type 2 diabetes mellitus with other diabetic kidney complication: Secondary | ICD-10-CM | POA: Diagnosis not present

## 2022-04-25 DIAGNOSIS — E663 Overweight: Secondary | ICD-10-CM | POA: Diagnosis not present

## 2022-04-25 DIAGNOSIS — F418 Other specified anxiety disorders: Secondary | ICD-10-CM | POA: Diagnosis not present

## 2022-04-25 DIAGNOSIS — D692 Other nonthrombocytopenic purpura: Secondary | ICD-10-CM | POA: Diagnosis not present

## 2022-04-25 DIAGNOSIS — R809 Proteinuria, unspecified: Secondary | ICD-10-CM | POA: Diagnosis not present

## 2022-04-25 DIAGNOSIS — I129 Hypertensive chronic kidney disease with stage 1 through stage 4 chronic kidney disease, or unspecified chronic kidney disease: Secondary | ICD-10-CM | POA: Diagnosis not present

## 2022-04-25 DIAGNOSIS — E7800 Pure hypercholesterolemia, unspecified: Secondary | ICD-10-CM | POA: Diagnosis not present

## 2022-04-25 DIAGNOSIS — Z1331 Encounter for screening for depression: Secondary | ICD-10-CM | POA: Diagnosis not present

## 2022-04-25 DIAGNOSIS — Z1389 Encounter for screening for other disorder: Secondary | ICD-10-CM | POA: Diagnosis not present

## 2022-07-16 DIAGNOSIS — Z23 Encounter for immunization: Secondary | ICD-10-CM | POA: Diagnosis not present

## 2022-11-20 DIAGNOSIS — M1990 Unspecified osteoarthritis, unspecified site: Secondary | ICD-10-CM | POA: Diagnosis not present

## 2022-11-20 DIAGNOSIS — E1129 Type 2 diabetes mellitus with other diabetic kidney complication: Secondary | ICD-10-CM | POA: Diagnosis not present

## 2022-11-20 DIAGNOSIS — M7502 Adhesive capsulitis of left shoulder: Secondary | ICD-10-CM | POA: Diagnosis not present

## 2022-11-20 DIAGNOSIS — F321 Major depressive disorder, single episode, moderate: Secondary | ICD-10-CM | POA: Diagnosis not present

## 2022-11-20 DIAGNOSIS — G4089 Other seizures: Secondary | ICD-10-CM | POA: Diagnosis not present

## 2022-11-20 DIAGNOSIS — I129 Hypertensive chronic kidney disease with stage 1 through stage 4 chronic kidney disease, or unspecified chronic kidney disease: Secondary | ICD-10-CM | POA: Diagnosis not present

## 2022-11-20 DIAGNOSIS — N182 Chronic kidney disease, stage 2 (mild): Secondary | ICD-10-CM | POA: Diagnosis not present

## 2022-11-20 DIAGNOSIS — J4530 Mild persistent asthma, uncomplicated: Secondary | ICD-10-CM | POA: Diagnosis not present

## 2022-11-20 DIAGNOSIS — E6609 Other obesity due to excess calories: Secondary | ICD-10-CM | POA: Diagnosis not present

## 2022-11-20 DIAGNOSIS — Z79899 Other long term (current) drug therapy: Secondary | ICD-10-CM | POA: Diagnosis not present

## 2022-11-20 DIAGNOSIS — E7800 Pure hypercholesterolemia, unspecified: Secondary | ICD-10-CM | POA: Diagnosis not present

## 2022-11-20 DIAGNOSIS — F418 Other specified anxiety disorders: Secondary | ICD-10-CM | POA: Diagnosis not present

## 2022-11-28 DIAGNOSIS — Z86010 Personal history of colonic polyps: Secondary | ICD-10-CM | POA: Diagnosis not present

## 2022-11-28 DIAGNOSIS — Z1211 Encounter for screening for malignant neoplasm of colon: Secondary | ICD-10-CM | POA: Diagnosis not present

## 2022-11-28 DIAGNOSIS — K219 Gastro-esophageal reflux disease without esophagitis: Secondary | ICD-10-CM | POA: Diagnosis not present

## 2022-11-28 DIAGNOSIS — K5730 Diverticulosis of large intestine without perforation or abscess without bleeding: Secondary | ICD-10-CM | POA: Diagnosis not present

## 2022-11-28 DIAGNOSIS — E669 Obesity, unspecified: Secondary | ICD-10-CM | POA: Diagnosis not present

## 2022-12-17 DIAGNOSIS — N401 Enlarged prostate with lower urinary tract symptoms: Secondary | ICD-10-CM | POA: Diagnosis not present

## 2022-12-17 DIAGNOSIS — R351 Nocturia: Secondary | ICD-10-CM | POA: Diagnosis not present

## 2022-12-17 DIAGNOSIS — R3912 Poor urinary stream: Secondary | ICD-10-CM | POA: Diagnosis not present

## 2022-12-17 DIAGNOSIS — Z789 Other specified health status: Secondary | ICD-10-CM | POA: Diagnosis not present

## 2022-12-17 DIAGNOSIS — N3941 Urge incontinence: Secondary | ICD-10-CM | POA: Diagnosis not present

## 2022-12-17 DIAGNOSIS — N320 Bladder-neck obstruction: Secondary | ICD-10-CM | POA: Diagnosis not present

## 2022-12-17 DIAGNOSIS — N529 Male erectile dysfunction, unspecified: Secondary | ICD-10-CM | POA: Diagnosis not present

## 2023-05-13 DIAGNOSIS — E1129 Type 2 diabetes mellitus with other diabetic kidney complication: Secondary | ICD-10-CM | POA: Diagnosis not present

## 2023-05-13 DIAGNOSIS — Z79899 Other long term (current) drug therapy: Secondary | ICD-10-CM | POA: Diagnosis not present

## 2023-05-13 DIAGNOSIS — N182 Chronic kidney disease, stage 2 (mild): Secondary | ICD-10-CM | POA: Diagnosis not present

## 2023-05-13 DIAGNOSIS — N401 Enlarged prostate with lower urinary tract symptoms: Secondary | ICD-10-CM | POA: Diagnosis not present

## 2023-05-13 DIAGNOSIS — I129 Hypertensive chronic kidney disease with stage 1 through stage 4 chronic kidney disease, or unspecified chronic kidney disease: Secondary | ICD-10-CM | POA: Diagnosis not present

## 2023-05-13 DIAGNOSIS — E7800 Pure hypercholesterolemia, unspecified: Secondary | ICD-10-CM | POA: Diagnosis not present

## 2023-05-16 DIAGNOSIS — Z1211 Encounter for screening for malignant neoplasm of colon: Secondary | ICD-10-CM | POA: Diagnosis not present

## 2023-05-16 DIAGNOSIS — Z86010 Personal history of colonic polyps: Secondary | ICD-10-CM | POA: Diagnosis not present

## 2023-05-16 DIAGNOSIS — K648 Other hemorrhoids: Secondary | ICD-10-CM | POA: Diagnosis not present

## 2023-05-16 DIAGNOSIS — K635 Polyp of colon: Secondary | ICD-10-CM | POA: Diagnosis not present

## 2023-05-16 DIAGNOSIS — D123 Benign neoplasm of transverse colon: Secondary | ICD-10-CM | POA: Diagnosis not present

## 2023-05-16 DIAGNOSIS — K5730 Diverticulosis of large intestine without perforation or abscess without bleeding: Secondary | ICD-10-CM | POA: Diagnosis not present

## 2023-05-19 DIAGNOSIS — E6609 Other obesity due to excess calories: Secondary | ICD-10-CM | POA: Diagnosis not present

## 2023-05-19 DIAGNOSIS — Z0000 Encounter for general adult medical examination without abnormal findings: Secondary | ICD-10-CM | POA: Diagnosis not present

## 2023-05-19 DIAGNOSIS — E7800 Pure hypercholesterolemia, unspecified: Secondary | ICD-10-CM | POA: Diagnosis not present

## 2023-05-19 DIAGNOSIS — I129 Hypertensive chronic kidney disease with stage 1 through stage 4 chronic kidney disease, or unspecified chronic kidney disease: Secondary | ICD-10-CM | POA: Diagnosis not present

## 2023-05-19 DIAGNOSIS — G4089 Other seizures: Secondary | ICD-10-CM | POA: Diagnosis not present

## 2023-05-19 DIAGNOSIS — F321 Major depressive disorder, single episode, moderate: Secondary | ICD-10-CM | POA: Diagnosis not present

## 2023-05-19 DIAGNOSIS — N182 Chronic kidney disease, stage 2 (mild): Secondary | ICD-10-CM | POA: Diagnosis not present

## 2023-05-19 DIAGNOSIS — D329 Benign neoplasm of meninges, unspecified: Secondary | ICD-10-CM | POA: Diagnosis not present

## 2023-05-19 DIAGNOSIS — Z1339 Encounter for screening examination for other mental health and behavioral disorders: Secondary | ICD-10-CM | POA: Diagnosis not present

## 2023-05-19 DIAGNOSIS — D692 Other nonthrombocytopenic purpura: Secondary | ICD-10-CM | POA: Diagnosis not present

## 2023-05-19 DIAGNOSIS — Z79899 Other long term (current) drug therapy: Secondary | ICD-10-CM | POA: Diagnosis not present

## 2023-05-19 DIAGNOSIS — R82998 Other abnormal findings in urine: Secondary | ICD-10-CM | POA: Diagnosis not present

## 2023-05-19 DIAGNOSIS — E1129 Type 2 diabetes mellitus with other diabetic kidney complication: Secondary | ICD-10-CM | POA: Diagnosis not present

## 2023-05-19 DIAGNOSIS — Z1331 Encounter for screening for depression: Secondary | ICD-10-CM | POA: Diagnosis not present

## 2023-05-19 DIAGNOSIS — Z6831 Body mass index (BMI) 31.0-31.9, adult: Secondary | ICD-10-CM | POA: Diagnosis not present

## 2023-06-04 DIAGNOSIS — N3941 Urge incontinence: Secondary | ICD-10-CM | POA: Diagnosis not present

## 2023-06-04 DIAGNOSIS — N401 Enlarged prostate with lower urinary tract symptoms: Secondary | ICD-10-CM | POA: Diagnosis not present

## 2023-06-04 DIAGNOSIS — R351 Nocturia: Secondary | ICD-10-CM | POA: Diagnosis not present

## 2023-06-04 DIAGNOSIS — R3912 Poor urinary stream: Secondary | ICD-10-CM | POA: Diagnosis not present

## 2023-06-04 DIAGNOSIS — N320 Bladder-neck obstruction: Secondary | ICD-10-CM | POA: Diagnosis not present

## 2023-12-03 NOTE — Telephone Encounter (Signed)
 Patient's wife calling to reschedule 6 month follow up with Dr.Tannenbaum, providers schedule not available thanks     CB: [phone]

## 2023-12-03 NOTE — Telephone Encounter (Signed)
 Appt r/s'd/Wife confirmed (on VC)  Electronically signed by: Rosina Jansky Priddy 12/03/2023 9:31 AM

## 2023-12-24 DIAGNOSIS — N3941 Urge incontinence: Secondary | ICD-10-CM | POA: Diagnosis not present

## 2023-12-24 NOTE — Progress Notes (Signed)
 History of Present Illness: Mr. Darin Harrington is a 73 y.o. male who presents today for follow up.  - Past medical history includes: - GU History:  Past medical history includes improvement on tamsulosin 0.4mg  x 2, hs; tolteradine LA 4mg  hs.  - GU History:  1.  73 yo male returns today seen in f/u.    He has BPH, and urge incontinence, post Detrol 2mg . However, cost of med has doubled, an pt cannot afford med. He has been treated with tolteradine, which has helped his nocturia. He continues to have weak stream, and urinary frequency.      Note hx of brain surgery in 2015 for meningioma, and stroke in immediate post op period. He has residual LUE weakness, with difficulty swallowing, and LLE weakness. He is also type II diabetes. He stopped Metformin . COPD was rx Prednisone .    He was placed on Detrol LA 2mg /day. He has not noticed any major difference. He noted severe dry mouth; but no constipation   At last visit on Jan. 31, pt failed Detrol,  2 tamsulosin/day. He is concerned with reports of dementia with Detrol. Dementia  (father). Pt was denied Myrbetriq in the past ( insurance)    Fall Screening:  Do you usually have a device to assist in your mobility? No   Medications:  Current Outpatient Medications  Medication Sig Dispense Refill  . acetaminophen  (TYLENOL ) 500 mg tablet Take 1,000 mg by mouth 3 (three) times a day.    . albuterol  HFA (PROVENTIL  HFA;VENTOLIN  HFA;PROAIR  HFA) 90 mcg/actuation inhaler Ventolin  HFA 90 mcg/actuation aerosol inhaler    . azelastine  (ASTELIN ) 137 mcg (0.1 %) nasal spray azelastine  137 mcg (0.1 %) nasal spray aerosol    . budesonide-formoteroL (SYMBICORT;BREYNA) 160-4.5 mcg/actuation inhaler Inhale 2 puffs.    . cetirizine (ZyrTEC) 10 mg tablet Take 10 mg by mouth Once Daily.    . chlorpheniramine  (CHLOR-TRIMETRON) 4 mg tablet Take 8 mg by mouth as needed.    . cholecalciferol (VITAMIN D3) 1,000 unit (25 mcg) tablet Take 1,000 Units by mouth Once Daily. 30  tablet 11  . ipratropium-albuteroL  (DUO-NEB) 0.5-2.5 mg/3 mL nebulizer solution 3 mL as needed.    . Lactobacillus acidophilus (Probiotic) 10 billion cell cap Take by mouth Indications: Daughter states medication is liquid..    . losartan (COZAAR) 25 mg tablet TAKE 1 TABLET BY MOUTH EVERY DAY  12  . montelukast  (SINGULAIR ) 10 mg tablet Take 10 mg by mouth nightly.    . omeprazole  (PriLOSEC) 40 mg DR capsule Take 40 mg by mouth 2 (two) times a day.    . omeprazole -sodium bicarbonate 40-1.1 mg-gram cap OmePPi 40 mg-1.1 gram capsule    . tamsulosin (FLOMAX) 0.4 mg cap Take 2 capsules (0.8 mg total) by mouth nightly. 180 capsule 3  . tolterodine (DETROL LA) 4 mg ER capsule Take 1 capsule (4 mg total) by mouth daily. 180 capsule 3  . Zetia 10 mg tablet TAKE 1 TABLET BY MOUTH EVERY DAY  12   No current facility-administered medications for this visit.     Allergies:  Allergies  Allergen Reactions  . Statins-Hmg-Coa Reductase Inhibitors GI Intolerance  . Amitriptyline Other (See Comments)    Severe cotton mouth.  . Atorvastatin GI Intolerance, Myalgias and Other (See Comments)    Intolerance to all Statins     Past Medical History:  Diagnosis Date  . Cataracts, bilateral   . Dental disease    tooth upper left mouth with need root canal- recent empiric  antibiotics completed 2 months ago without evidence infection  . Diabetes mellitus (CMD)    HgbA1c 5.5% on 07/2013 per patient report- PCP Dr. Tisovac following  . ED (erectile dysfunction)   . GERD (gastroesophageal reflux disease) 1997   previously on Priolosec and now diet control  . Hip pain   . HLD (hyperlipidemia)   . HOH (hard of hearing)    left ear- no hearing aides  . Meningioma (CMD) 2015  . Meningioma (CMD)    right orbitosphenoid   . OA (osteoarthritis)    hips- right > left; shoulders- right > left  . Obesity    resolving following intentional 70 lb weight loss; BMI 29.2  . Optic neuropathy    compression  .  Seasonal allergies   . Seizures (CMD)    petit per patient report prior to grand mal seizure on 10/04/13  . Tinnitus, subjective    left ear  . Transfusion history    1961  . Visual field loss    right     Past Surgical History:  Procedure Laterality Date  . CARDIAC SURGERY  ? 1997   Procedure: CARDIAC SURGERY; epigastric pain with negative heart cath- Dx GERD  . COLONOSCOPY  07/31/10   Procedure: COLONOSCOPY  . CRANIOTOMY Right 12/03/2013   Procedure: CRANIOTOMY SKULL BASE APPROACH ANTERIOR SUPINE - RIGHT ANTERIOR SKULL BASE/PTERIONAL CRANI W/BRAIN LAB, CUSA, MICROSCOPE;  Surgeon: Norleen Dasie Blush, MD;  Location: [facility] MAIN OR;  Service: Neurosurgery;  Laterality: Right;  RIGHT ANTERIOR SKULL BASE/PTERIONAL CRANI W/BRAIN LAB, CUSA, MICROSCOPE  . OTHER SURGICAL HISTORY  1961   Procedure: OTHER SURGICAL HISTORY (blood tumor left thigh)  . PLEURAL SCARIFICATION     Procedure: PLEURAL SCARIFICATION  . POPLITEAL SYNOVIAL CYST EXCISION Left 1963   Procedure: POPLITEAL SYNOVIAL CYST EXCISION  . SKIN BIOPSY     Procedure: SKIN BIOPSY; moles back- actinic keratosis  . TONSILLECTOMY     Procedure: TONSILLECTOMY    Family History  Problem Relation Name Age of Onset  . Cancer Mother    . Dementia Father    . Asthma Father    . Seizures Sister    . Anesthesia problems Neg Hx      Social History   Socioeconomic History  . Marital status: Married    Spouse name: Not on file  . Number of children: Not on file  . Years of education: Not on file  . Highest education level: Not on file  Occupational History  . Not on file  Tobacco Use  . Smoking status: Never  . Smokeless tobacco: Never  Substance and Sexual Activity  . Alcohol  use: No  . Drug use: No  . Sexual activity: Not on file  Other Topics Concern  . Not on file  Social History Narrative  . Not on file   Social Drivers of Health   Food Insecurity: Low Risk  (12/24/2023)   Food vital sign   . Within the past 12  months, you worried that your food would run out before you got money to buy more: Never true   . Within the past 12 months, the food you bought just didn't last and you didn't have money to get more: Never true  Transportation Needs: No Transportation Needs (12/24/2023)   Transportation   . In the past 12 months, has lack of reliable transportation kept you from medical appointments, meetings, work or from getting things needed for daily living? : No  Safety: Low Risk  (  12/24/2023)   Safety   . How often does anyone, including family and friends, physically hurt you?: Never   . How often does anyone, including family and friends, insult or talk down to you?: Never   . How often does anyone, including family and friends, threaten you with harm?: Never   . How often does anyone, including family and friends, scream or curse at you?: Never  Living Situation: Low Risk  (12/24/2023)   Living Situation   . What is your living situation today?: I have a steady place to live   . Think about the place you live. Do you have problems with any of the following? Choose all that apply:: None/None on this list      SUBJECTIVE  Review of Systems Constitutional:  Patient denies any unintentional weight loss or change in strength Integumentary:  Patient denies any rashes or pruritus Eyes:  Patient denies double vision or eye pain Ears/Nose/Mouth/Throat:  Patient denies any nosebleeds or gum bleeding Cardiovascular:  Patient denies chest pain or syncope Respiratory: Patient denies hemoptysis  Gastrointestinal:  Patient denies nausea, vomiting, constipation, or diarrhea Musculoskeletal:  Patient denies muscle cramps or weakness Neurologic: Patient denies convulsions or seizures Psychiatric: Patient denies hallucinations or suicidal ideations Allergic/Immunologic: Patient denies food allergies Hematologic/Lymphatic: Patient denies bleeding tendencies Endocrine:  Patient denies heat/cold intolerance  GU:  As per HPI.   OBJECTIVE Vitals:   12/24/23 0901  BP: 121/80  Pulse: 79  Temp: 96.2 F (35.7 C)  SpO2: 96%     There is no height or weight on file to calculate BMI.  Physical Examination  Constitutional:  No obvious distress; patient is non-toxic appearing Cardiovascular: Pulse is regular Respiratory: The patient does not have audible wheezing/stridor; respirations do not appear labored Gastrointestinal: Abdomen non-distended Musculoskeletal: Normal ROM of UEs Skin: No obvious rashes/open sores Neurologic: CN 2-12 grossly intact Psychiatric: Answered questions appropriately with normal affect Hematologic/Lymphatic/Immunologic: No obvious bruises or sites of spontaneous bleeding  Urodynamic Testing - PVR  Date/Time: 12/24/2023 9:00 AM  Performed by: Arlena Chauncey Gal, MD Authorized by: Arlena Chauncey Gal, MD   Procedure discussed: discussed risks, benefits and alternatives   Chaperone present: yes   Timeout: timeout called immediately prior to procedure   Prep: patient was prepped and draped in usual sterile fashion   Position: sitting  Procedure Details    Procedure: bladder scan     PVR Details:     Residual urine (mL): 0     Post-Procedure Details    Outcome: patient tolerated procedure well with no complications     Post-procedure interventions: post-procedure instructions given       PVR: 0 ml   ASSESSMENT No diagnosis found.  Urinary incontinence. He is unable to afford mirabegron, and should not take anticholinergics. However, he can get Enablex from [employer] for a reasonable price. He is cautioned re: dementia side effects.  He is doing well.   Will plan for follow up in 6 months / 1 year or sooner if needed. Pt verbalized understanding and agreement. All questions were answered.  PLAN Advised the following: 1. Enablex 7.5 mg, bid. Pt will  be acutely aware of constipation, dementia.  2. 6 month follow-up   No orders of  the defined types were placed in this encounter.   It has been explained that the patient is to follow regularly with their PCP in addition to all other providers involved in their care and to follow instructions provided by these  respective offices. Patient advised to contact urology clinic if any urologic-pertaining questions, concerns, new symptoms or problems arise in the interim period.    Electronically signed by: Arlena Chauncey Gal, MD 12/24/2023 9:59 AM

## 2024-02-16 DIAGNOSIS — M7502 Adhesive capsulitis of left shoulder: Secondary | ICD-10-CM | POA: Diagnosis not present

## 2024-02-16 DIAGNOSIS — F321 Major depressive disorder, single episode, moderate: Secondary | ICD-10-CM | POA: Diagnosis not present

## 2024-02-16 DIAGNOSIS — F418 Other specified anxiety disorders: Secondary | ICD-10-CM | POA: Diagnosis not present

## 2024-02-16 DIAGNOSIS — E7800 Pure hypercholesterolemia, unspecified: Secondary | ICD-10-CM | POA: Diagnosis not present

## 2024-02-16 DIAGNOSIS — I129 Hypertensive chronic kidney disease with stage 1 through stage 4 chronic kidney disease, or unspecified chronic kidney disease: Secondary | ICD-10-CM | POA: Diagnosis not present

## 2024-02-16 DIAGNOSIS — E1129 Type 2 diabetes mellitus with other diabetic kidney complication: Secondary | ICD-10-CM | POA: Diagnosis not present

## 2024-02-16 DIAGNOSIS — G4089 Other seizures: Secondary | ICD-10-CM | POA: Diagnosis not present

## 2024-02-16 DIAGNOSIS — N182 Chronic kidney disease, stage 2 (mild): Secondary | ICD-10-CM | POA: Diagnosis not present

## 2024-02-16 DIAGNOSIS — E6609 Other obesity due to excess calories: Secondary | ICD-10-CM | POA: Diagnosis not present

## 2024-02-16 DIAGNOSIS — J4530 Mild persistent asthma, uncomplicated: Secondary | ICD-10-CM | POA: Diagnosis not present

## 2024-02-16 DIAGNOSIS — H9319 Tinnitus, unspecified ear: Secondary | ICD-10-CM | POA: Diagnosis not present

## 2024-02-16 DIAGNOSIS — D329 Benign neoplasm of meninges, unspecified: Secondary | ICD-10-CM | POA: Diagnosis not present

## 2024-02-27 DIAGNOSIS — H47099 Other disorders of optic nerve, not elsewhere classified, unspecified eye: Secondary | ICD-10-CM | POA: Diagnosis not present

## 2024-02-27 DIAGNOSIS — D329 Benign neoplasm of meninges, unspecified: Secondary | ICD-10-CM | POA: Diagnosis not present

## 2024-02-27 DIAGNOSIS — H5203 Hypermetropia, bilateral: Secondary | ICD-10-CM | POA: Diagnosis not present

## 2024-02-27 DIAGNOSIS — H52223 Regular astigmatism, bilateral: Secondary | ICD-10-CM | POA: Diagnosis not present

## 2024-02-27 DIAGNOSIS — H5340 Unspecified visual field defects: Secondary | ICD-10-CM | POA: Diagnosis not present

## 2024-02-27 DIAGNOSIS — H2513 Age-related nuclear cataract, bilateral: Secondary | ICD-10-CM | POA: Diagnosis not present

## 2024-02-27 DIAGNOSIS — H524 Presbyopia: Secondary | ICD-10-CM | POA: Diagnosis not present

## 2024-02-27 DIAGNOSIS — E1136 Type 2 diabetes mellitus with diabetic cataract: Secondary | ICD-10-CM | POA: Diagnosis not present

## 2024-03-01 NOTE — Progress Notes (Signed)
 02/27/24   CHIEF COMPLAINT Patient presents for  Chief Complaint  Patient presents with  . Blurry Vision Both Eyes  . Diabetic Eye Exam      HISTORY OF PRESENT ILLNESS: Darin Harrington is a 73 y.o. male who present to the clinic today for:  HPI     Diabetic Eye Exam   I, the optometrist, performed the HPI with the patient and updated the documentation appropriately.  The patient is here for a return visit.  The patient's treatment for diabetes includes diet controlled.  The patient's last A1C was 8.  The patient requests a new glasses prescription.        Comments   73 Year Old Male.   Last Seen With Dr.Hensel On 06-17-2022. History of:  1. Sector Associate Professor Defect OD.   2. 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 6. Hyperopia with Astigmatism and Presbyopia OU.    --Not Using Any Eye Drops.  --He Says His Distance Vision Is Good, But His Near Vision Is Not Good.   Would Like To Update Eyeglass Rx.  --Headaches.   He Says Mostly Due To Allergies.   Not Often. --No Eye Pain ou.   Both Eyes Red.    No Discharge.  No Swelling ou.    Right Eye Ptosis.   --No Double Vision Now.  He Did Experience It Before Because Of Brain Surgery. --On and Off Floaters ou.   Nothing New.     No Flashes ou.    No Sudden Vision Loss.                  --Type 2 Diabetes.   Diagnosed Around 2009.   Not Taking Medications For It At This Time.   A1C# Around 8.0.    --Denies Having HTN.          Hx of Brain Surgery For Meningioma and Stroke.  (2015). Please See Chart For A Detailed Health History and Allergy  List For Any Allergies.      Did see Dr. Gladis last 2023 And also sees neurosurgery (Dr. Tanda) 2023          Last edited by Inocente FORBES Pao, OD on 02/27/2024  3:12 PM.      HISTORICAL INFORMATION:  CURRENT MEDICATIONS: Current Outpatient Medications on File Prior to Visit   Medication Sig Dispense Refill  . acetaminophen  (TYLENOL ) 325 mg tablet Take 650 mg by mouth every 6 (six) hours as needed.    . acetaminophen  (TYLENOL ) 500 mg tablet Take 1,000 mg by mouth 3 (three) times a day.    . albuterol  2.5 mg /3 mL (0.083 %) nebulizer solution AS NEEDED INHALATION EVERY 6 HOURS for 25    . albuterol  HFA (PROVENTIL  HFA;VENTOLIN  HFA;PROAIR  HFA) 90 mcg/actuation inhaler Ventolin  HFA 90 mcg/actuation aerosol inhaler    . azelastine  (ASTELIN ) 137 mcg (0.1 %) nasal spray azelastine  137 mcg (0.1 %) nasal spray aerosol    . budesonide-formoteroL (SYMBICORT;BREYNA) 160-4.5 mcg/actuation inhaler Inhale 2 puffs.    . cetirizine (ZyrTEC) 10 mg tablet Take 10 mg by mouth Once Daily.    . chlorpheniramine  (CHLOR-TRIMETRON) 4 mg tablet Take 8 mg by mouth as needed.    . chlorpheniramine  (CHLOR-TRIMETRON) 4 mg tablet Take 2 tablets by mouth in the morning and 2 tablets at noon and 2 tablets in the evening.    . cholecalciferol (VITAMIN D3) 1,000 unit (25  mcg) tablet Take 1,000 Units by mouth Once Daily. 30 tablet 11  . darifenacin (ENABLEX) 15 mg 24 hr tablet Take 1 tablet (15 mg total) by mouth daily. 90 tablet 3  . escitalopram (LEXAPRO) 20 mg tablet     . ezetimibe (ZETIA) 10 mg tablet Take 1 tablet by mouth daily.    . fluticasone propionate (FLONASE) 50 mcg/spray nasal spray     . guaiFENesin 400 mg tab TAKE 1 TABLET BY MOUTH 3 TIMES A DAY FOR MUCOUS THINNING    . ipratropium-albuteroL  (DUO-NEB) 0.5-2.5 mg/3 mL nebulizer solution 3 mL as needed.    . Lactobacillus acidophilus (Probiotic) 10 billion cell cap Take by mouth Indications: Daughter states medication is liquid..    . levETIRAcetam  (KEPPRA ) 500 mg tablet     . losartan (COZAAR) 25 mg tablet TAKE 1 TABLET BY MOUTH EVERY DAY  12  . losartan (COZAAR) 25 mg tablet Take 1 tablet by mouth daily.    . metFORMIN  (GLUCOPHAGE ) 500 mg tablet     . montelukast  (SINGULAIR ) 10 mg tablet Take 10 mg by mouth nightly.    .  montelukast  (SINGULAIR ) 10 mg tablet Take 1 tablet by mouth daily.    . omeprazole  (PriLOSEC) 40 mg DR capsule Take 40 mg by mouth 2 (two) times a day.    . omeprazole  (PriLOSEC) 40 mg DR capsule 1 capsule 30 minutes before morning meal Orally Once a day for 90 days    . omeprazole -sodium bicarbonate 40-1.1 mg-gram cap OmePPi 40 mg-1.1 gram capsule    . tamsulosin (FLOMAX) 0.4 mg cap Take 2 capsules (0.8 mg total) by mouth nightly. 180 capsule 3  . tamsulosin (FLOMAX) 0.4 mg cap 2 capsules.    SABRA tolterodine (DETROL) 2 mg tablet 1 tablet.    SABRA Zetia 10 mg tablet TAKE 1 TABLET BY MOUTH EVERY DAY  12   No current facility-administered medications on file prior to visit.    Referring physician: No referring provider defined for this encounter.  REVIEW OF SYSTEMS ROS   Positive for: Eyes Last edited by Jon Garin - Devore, COA on 02/27/2024  2:17 PM.      ALLERGIES Allergies  Allergen Reactions  . Statins-Hmg-Coa Reductase Inhibitors GI Intolerance and Other (See Comments)    Intolerance to all Statins  . Amitriptyline Other (See Comments)    Severe cotton mouth.  . Atorvastatin GI Intolerance, Myalgias and Other (See Comments)    Intolerance to all Statins    PAST MEDICAL HISTORY Past Medical History:  Diagnosis Date  . Cataracts, bilateral   . Dental disease    tooth upper left mouth with need root canal- recent empiric antibiotics completed 2 months ago without evidence infection  . Diabetes mellitus    (CMD)    HgbA1c 5.5% on 07/2013 per patient report- PCP Dr. Tisovac following  . ED (erectile dysfunction)   . GERD (gastroesophageal reflux disease) 1997   previously on Priolosec and now diet control  . Hip pain   . HLD (hyperlipidemia)   . HOH (hard of hearing)    left ear- no hearing aides  . Meningioma    (CMD) 2015  . Meningioma    (CMD)    right orbitosphenoid   . OA (osteoarthritis)    hips- right > left; shoulders- right > left  . Obesity    resolving  following intentional 70 lb weight loss; BMI 29.2  . Optic neuropathy    compression  . Seasonal allergies   .  Seizures    (CMD)    petit per patient report prior to grand mal seizure on 10/04/13  . Tinnitus, subjective    left ear  . Transfusion history    1961  . Visual field loss    right    PAST SURGICAL HISTORY Past Surgical History:  Procedure Laterality Date  . CARDIAC SURGERY  ? 1997   Procedure: CARDIAC SURGERY; epigastric pain with negative heart cath- Dx GERD  . COLONOSCOPY  07/31/10   Procedure: COLONOSCOPY  . CRANIOTOMY Right 12/03/2013   Procedure: CRANIOTOMY SKULL BASE APPROACH ANTERIOR SUPINE - RIGHT ANTERIOR SKULL BASE/PTERIONAL CRANI W/BRAIN LAB, CUSA, MICROSCOPE;  Surgeon: Norleen Dasie Blush, MD;  Location: [facility] MAIN OR;  Service: Neurosurgery;  Laterality: Right;  RIGHT ANTERIOR SKULL BASE/PTERIONAL CRANI W/BRAIN LAB, CUSA, MICROSCOPE  . OTHER SURGICAL HISTORY  1961   Procedure: OTHER SURGICAL HISTORY (blood tumor left thigh)  . PLEURAL SCARIFICATION     Procedure: PLEURAL SCARIFICATION  . POPLITEAL SYNOVIAL CYST EXCISION Left 1963   Procedure: POPLITEAL SYNOVIAL CYST EXCISION  . SKIN BIOPSY     Procedure: SKIN BIOPSY; moles back- actinic keratosis  . TONSILLECTOMY     Procedure: TONSILLECTOMY    FAMILY HISTORY Family History  Problem Relation Name Age of Onset  . Cancer Mother    . Dementia Father    . Asthma Father    . Seizures Sister    . Anesthesia problems Neg Hx      SOCIAL HISTORY Social History   Tobacco Use  . Smoking status: Never    Passive exposure: Past  . Smokeless tobacco: Never  Vaping Use  . Vaping status: Never Used  . Passive vaping exposure: Yes  Substance Use Topics  . Alcohol  use: No  . Drug use: No    OPHTHALMIC EXAM Base Eye Exam     Visual Acuity (Snellen - Linear)       Right Left   Dist cc 20/20 -1 20/20    Correction: Glasses         Tonometry (Tonopen: Tetracaine OU, 2:16 PM)       Right Left    Pressure 10 10         Pupils       Pupils Dark Light Shape APD   Right PERRL 2 1 Round None   Left PERRL 2 1 Round None         Extraocular Movement       Right Left    Abnormal Full    -- -- --  --  --  -- -- --   -- -- --  --  --  -- -- --           Neuro/Psych     Oriented x3: Yes   Mood/Affect: Normal         Dilation     Both eyes: 1.0% Tropicamide @ 2:16 PM           Slit Lamp and Fundus Exam     External Exam       Right Left   External indistinct lid crease          Slit Lamp Exam       Right Left   Lids/Lashes 2+ Ptosis Normal   Conjunctiva/Sclera White and quiet White and quiet   Cornea Clear Clear   Anterior Chamber Deep and quiet Deep and quiet   Iris Round and reactive Round and reactive   Lens 1+ Nuclear sclerosis,  Vacuoles 1+ Nuclear sclerosis, Vacuoles   Anterior Vitreous Normal Normal         Fundus Exam       Right Left   Disc 1+ Pallor Normal   C/D Ratio 0.15 0.15   Macula Normal Normal   Vessels Normal Normal   Periphery Normal Normal   (-) hemorrhages, (-) exudates (-) cotton wool spot (-) macular edema OU            Refraction     Wearing Rx       Sphere Cylinder Axis Add   Right +4.75 -2.25 032 +2.75   Left +5.25 -2.00 175 +2.75    Age: Rx From 2023   Type: Progressives         Wearing Rx #2       Sphere Cylinder Axis Add   Right +4.75 -2.25 032 +2.75   Left +5.25 -2.00 175 +2.75         Manifest Refraction (Auto)       Sphere Cylinder Axis   Right +2.50 +2.00 117   Left +3.50 +2.00 086         Cycloplegic Refraction       Sphere Cylinder Axis Dist VA   Right +2.25 +2.25 120 20/25-   Left +3.50 +2.00 086 20/20         Final Rx       Sphere Cylinder Axis Dist VA Add   Right +2.25 +2.25 120 20/25- +3.00   Left +3.50 +2.00 086 20/20 +3.00    Expiration Date: 02/26/2025             IMAGING AND PROCEDURES  Imaging and Procedures for 03/01/2024:    Prior  Imaging and Procedures:    ASSESSMENT/PLAN:  1. Nuclear sclerosis of both eyes (Primary) Mild, monitor  2. Hyperopia of both eyes with regular astigmatism and presbyopia Rx glasses  3. 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 Compressive optic neuropathy   5. Visual field loss  stable  Last visit with neurosurgery and neuro-ophthalmology (Dr. Gladis) 2023 Dr. Tanda, Neurosurgery, per 03/11/22 notes plans for 4 year f/u/MRI and Dr. Gladis indicated to plan for neuro-ophthalmology f/u that same time  F/u 1 year with me and rtc/call sooner with vision changes/new vision/ocular symptoms  Ophthalmic Meds Ordered this visit: No orders of the defined types were placed in this encounter.     Return in about 1 year (around 02/26/2025).  There are no Patient Instructions on file for this visit.   Explained the diagnoses, plan, and follow up with the patient and they expressed understanding.  Patient expressed understanding of the importance of proper follow up care.    Abbreviations: M myopia (nearsighted); A astigmatism; H hyperopia (farsighted); P presbyopia; Mrx spectacle prescription;  CTL contact lenses; OD right eye; OS left eye; OU both eyes  XT exotropia; ET esotropia; PEK punctate epithelial keratitis; PEE punctate epithelial erosions; DES dry eye syndrome; MGD meibomian gland dysfunction; ATs artificial tears; PFAT's preservative free artificial tears; NSC nuclear sclerotic cataract; PSC posterior subcapsular cataract; ERM epi-retinal membrane; PVD posterior vitreous detachment; RD retinal detachment; DM diabetes mellitus; DR diabetic retinopathy; NPDR non-proliferative diabetic retinopathy; PDR proliferative diabetic retinopathy; CSME clinically significant macular edema; DME diabetic macular edema; dbh dot blot hemorrhages; CWS cotton wool spot; POAG primary open angle glaucoma; C/D cup-to-disc ratio; HVF humphrey visual field; GVF goldmann  visual field; OCT optical coherence tomography; IOP intraocular pressure; BRVO Branch retinal vein  occlusion; CRVO central retinal vein occlusion; CRAO central retinal artery occlusion; BRAO branch retinal artery occlusion; RT retinal tear; SB scleral buckle; PPV pars plana vitrectomy; VH Vitreous hemorrhage; PRP panretinal laser photocoagulation; IVK intravitreal kenalog; VMT vitreomacular traction; MH Macular hole;  NVD neovascularization of the disc; NVE neovascularization elsewhere; AREDS age related eye disease study; ARMD age related macular degeneration; POAG primary open angle glaucoma; EBMD epithelial/anterior basement membrane dystrophy; ACIOL anterior chamber intraocular lens; IOL intraocular lens; PCIOL posterior chamber intraocular lens; Phaco/IOL phacoemulsification with intraocular lens placement; PRK photorefractive keratectomy; LASIK laser assisted in situ keratomileusis; HTN hypertension; DM diabetes mellitus; COPD chronic obstructive pulmonary disease

## 2024-03-30 DIAGNOSIS — L821 Other seborrheic keratosis: Secondary | ICD-10-CM | POA: Diagnosis not present

## 2024-03-30 DIAGNOSIS — D225 Melanocytic nevi of trunk: Secondary | ICD-10-CM | POA: Diagnosis not present

## 2024-05-07 DIAGNOSIS — E7849 Other hyperlipidemia: Secondary | ICD-10-CM | POA: Diagnosis not present

## 2024-05-07 DIAGNOSIS — E538 Deficiency of other specified B group vitamins: Secondary | ICD-10-CM | POA: Diagnosis not present

## 2024-05-07 DIAGNOSIS — Z1212 Encounter for screening for malignant neoplasm of rectum: Secondary | ICD-10-CM | POA: Diagnosis not present

## 2024-05-11 DIAGNOSIS — S70361A Insect bite (nonvenomous), right thigh, initial encounter: Secondary | ICD-10-CM | POA: Diagnosis not present

## 2024-05-14 DIAGNOSIS — N182 Chronic kidney disease, stage 2 (mild): Secondary | ICD-10-CM | POA: Diagnosis not present

## 2024-05-14 DIAGNOSIS — N401 Enlarged prostate with lower urinary tract symptoms: Secondary | ICD-10-CM | POA: Diagnosis not present

## 2024-05-14 DIAGNOSIS — E7800 Pure hypercholesterolemia, unspecified: Secondary | ICD-10-CM | POA: Diagnosis not present

## 2024-05-14 DIAGNOSIS — I129 Hypertensive chronic kidney disease with stage 1 through stage 4 chronic kidney disease, or unspecified chronic kidney disease: Secondary | ICD-10-CM | POA: Diagnosis not present

## 2024-05-21 DIAGNOSIS — R82998 Other abnormal findings in urine: Secondary | ICD-10-CM | POA: Diagnosis not present

## 2024-05-21 DIAGNOSIS — F418 Other specified anxiety disorders: Secondary | ICD-10-CM | POA: Diagnosis not present

## 2024-05-21 DIAGNOSIS — N182 Chronic kidney disease, stage 2 (mild): Secondary | ICD-10-CM | POA: Diagnosis not present

## 2024-05-21 DIAGNOSIS — Z1331 Encounter for screening for depression: Secondary | ICD-10-CM | POA: Diagnosis not present

## 2024-05-21 DIAGNOSIS — E7800 Pure hypercholesterolemia, unspecified: Secondary | ICD-10-CM | POA: Diagnosis not present

## 2024-05-21 DIAGNOSIS — E1129 Type 2 diabetes mellitus with other diabetic kidney complication: Secondary | ICD-10-CM | POA: Diagnosis not present

## 2024-05-21 DIAGNOSIS — J4530 Mild persistent asthma, uncomplicated: Secondary | ICD-10-CM | POA: Diagnosis not present

## 2024-05-21 DIAGNOSIS — E6609 Other obesity due to excess calories: Secondary | ICD-10-CM | POA: Diagnosis not present

## 2024-05-21 DIAGNOSIS — G4089 Other seizures: Secondary | ICD-10-CM | POA: Diagnosis not present

## 2024-05-21 DIAGNOSIS — N401 Enlarged prostate with lower urinary tract symptoms: Secondary | ICD-10-CM | POA: Diagnosis not present

## 2024-05-21 DIAGNOSIS — F321 Major depressive disorder, single episode, moderate: Secondary | ICD-10-CM | POA: Diagnosis not present

## 2024-05-21 DIAGNOSIS — Z1339 Encounter for screening examination for other mental health and behavioral disorders: Secondary | ICD-10-CM | POA: Diagnosis not present

## 2024-05-21 DIAGNOSIS — Z0000 Encounter for general adult medical examination without abnormal findings: Secondary | ICD-10-CM | POA: Diagnosis not present

## 2024-05-21 DIAGNOSIS — H9319 Tinnitus, unspecified ear: Secondary | ICD-10-CM | POA: Diagnosis not present

## 2024-05-21 DIAGNOSIS — I129 Hypertensive chronic kidney disease with stage 1 through stage 4 chronic kidney disease, or unspecified chronic kidney disease: Secondary | ICD-10-CM | POA: Diagnosis not present

## 2024-05-21 DIAGNOSIS — D329 Benign neoplasm of meninges, unspecified: Secondary | ICD-10-CM | POA: Diagnosis not present

## 2024-06-23 DIAGNOSIS — N320 Bladder-neck obstruction: Secondary | ICD-10-CM | POA: Diagnosis not present

## 2024-08-02 DIAGNOSIS — S299XXA Unspecified injury of thorax, initial encounter: Secondary | ICD-10-CM | POA: Diagnosis not present

## 2024-08-02 DIAGNOSIS — I129 Hypertensive chronic kidney disease with stage 1 through stage 4 chronic kidney disease, or unspecified chronic kidney disease: Secondary | ICD-10-CM | POA: Diagnosis not present

## 2024-08-02 DIAGNOSIS — W010XXA Fall on same level from slipping, tripping and stumbling without subsequent striking against object, initial encounter: Secondary | ICD-10-CM | POA: Diagnosis not present
